# Patient Record
Sex: Male | Born: 1990 | Race: White | Hispanic: No | Marital: Single | Smoking: Never smoker
Health system: Southern US, Community
[De-identification: ages and names within clinical notes are randomized; demographics above are authoritative.]

---
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
## 2017-05-25 ENCOUNTER — Emergency Department
Admission: EM | Admit: 2017-05-25 | Discharge: 2017-05-25 | Disposition: A | Discharge status: Home / Self Care | Payer: BC Managed Care – PPO | Source: Home / Self Care | Attending: Family Medicine | Admitting: Family Medicine

## 2017-05-25 ENCOUNTER — Emergency Department (INDEPENDENT_AMBULATORY_CARE_PROVIDER_SITE_OTHER): Payer: BC Managed Care – PPO

## 2017-05-25 DIAGNOSIS — M25461 Effusion, right knee: Secondary | ICD-10-CM | POA: Diagnosis not present

## 2017-05-25 DIAGNOSIS — S8391XA Sprain of unspecified site of right knee, initial encounter: Secondary | ICD-10-CM | POA: Diagnosis not present

## 2017-05-25 NOTE — ED Provider Notes (Signed)
[facility]    CSN: [id number] Arrival date & time: 05/25/17  1641     History   Chief Complaint Chief Complaint  Patient presents with  . Knee Pain    HPI Patrick Bradley is a 27 y.o. male.   Patient reports that he went snowboarding two days ago, experiencing no injury, and yesterday walked a lot of stairs.  Today he has increased right anterior knee pain and limited range of motion.  He has a past history of right knee surgery for resection of a benign cyst.   The history is provided by the patient.  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  There are no active problems to display for this patient.        Home Medications    Prior to Admission medications   Not on File    Family History No family history on file.  Social History Social History   Tobacco Use  . Smoking status: Never Smoker  . Smokeless tobacco: Never Used  Substance Use Topics  . Alcohol use: No    Frequency: Never  . Drug use: No     Allergies   Patient has no known allergies.   Review of Systems Review of Systems  Constitutional: Negative for fatigue and fever.  Musculoskeletal: Positive for stiffness. Negative for back pain.  All other systems reviewed and are negative.    Physical Exam Triage Vital Signs ED Triage Vitals  Enc Vitals Group     BP  05/25/17 1702 137/78     Pulse Rate 05/25/17 1702 88     Resp 05/25/17 1702 16     Temp 05/25/17 1702 97.9 F (36.6 C)     Temp Source 05/25/17 1702 Oral     SpO2 05/25/17 1702 98 %     Weight 05/25/17 1703 178 lb 8 oz (81 kg)     Height 05/25/17 1703 6' (1.829 m)     Head Circumference --      Peak Flow --      Pain Score 05/25/17 1704 5     Pain Loc --      Pain Edu? --      Excl. in GC? --    No data found.  Updated Vital Signs BP 137/78 (BP Location: Right Arm)   Pulse 88   Temp 97.9 F (36.6 C) (Oral)   Resp 16   Ht 6' (1.829 m)   Wt 178 lb 8 oz (81 kg)   SpO2 98%   BMI 24.21 kg/m   Visual Acuity Right Eye Distance:   Left Eye Distance:   Bilateral Distance:    Right Eye Near:   Left Eye Near:    Bilateral Near:     Physical Exam  Constitutional: He appears well-developed and well-nourished. No distress.  HENT:  Head: Atraumatic.  Eyes: Pupils are equal, round, and reactive to light.  Neck: Normal range of motion.  Cardiovascular: Normal rate.  Pulmonary/Chest: Effort normal.  Musculoskeletal:       Left knee: He exhibits decreased range of motion. He exhibits no swelling, no ecchymosis, no deformity, no laceration, no erythema, normal alignment, no LCL laxity and normal meniscus. Tenderness found. LCL tenderness noted.       Legs: Right knee:  Mild swelling above patella; no erythema or warmth.  There is tenderness to palpation over the lateral collateral ligament.  Knee stable, negative drawer test.  McMurray test negative.  Has difficulty extending fully, and flexing more than 90 degrees.  Neurological: He is alert.  Skin: Skin is warm and dry.  Nursing note and vitals reviewed.    UC Treatments / Results  Labs (all labs ordered are listed, but only abnormal results are displayed) Labs Reviewed - No data to display  EKG  EKG Interpretation None       Radiology Dg Knee Complete 4 Views Right  Result Date: 05/25/2017 CLINICAL DATA:  RIGHT  knee pain and swelling since snowboarding 2 days ago, increased pain with flexion, past history of tumor resection in 2015 EXAM: RIGHT KNEE - COMPLETE 4+ VIEW COMPARISON:  None. FINDINGS: Osseous mineralization normal. Joint space narrowing medial and lateral compartments. Calcified body at suprapatellar recess with small joint effusion. Focus of cement at the lateral femoral condyles extending into intercondylar region with 2 anchors present laterally. No acute fracture, dislocation or bone destruction. IMPRESSION: Postsurgical changes of lateral femoral condyle. Joint effusion with calcified loose body at suprapatellar recess. Degenerative changes without acute bony abnormality. Electronically Signed   By: Ulyses SouthwardMark  Boles M.D.   On: 05/25/2017 18:03    Procedures Procedures (including critical care time)  Medications Ordered in UC Medications - No data to display   Initial Impression / Assessment and Plan / UC Course  I have reviewed the triage vital signs and the nursing notes.  Pertinent labs & imaging results that were available during my care of the patient were reviewed by me and considered in my medical decision making (see chart for details).    Ace wrap applied. Apply ice pack for 30 minutes, three to four times daily until swelling resolves.  Wear ace wrap until swelling resolves.  Elevate.  Use crutches for 3 to 5 days.  Begin wearing knee brace after discontinuing ace wrap.  Wear brace for about 2 to 3 weeks.  Begin range of motion and stretching exercises in about 5 days as tolerated.  May take Ibuprofen 200mg , 4 tabs every 8 hours with food.  Followup with Dr. Rodney Langtonhomas Thekkekandam or Dr. Clementeen GrahamEvan Corey ([facility]) if not improving about two weeks.     Final Clinical Impressions(s) / UC Diagnoses   Final diagnoses:  Sprain of right knee, unspecified ligament, initial encounter    ED Discharge Orders    None           Lattie HawBeese, Falan Hensler A, MD 05/29/17 [phone]

## 2017-05-25 NOTE — Discharge Instructions (Signed)
Apply ice pack for 30 minutes, three to four times daily until swelling resolves.  Wear ace wrap until swelling resolves.  Elevate.  Use crutches for 3 to 5 days.  Begin wearing knee brace after discontinuing ace wrap.  Wear brace for about 2 to 3 weeks.  Begin range of motion and stretching exercises in about 5 days as tolerated.  May take Ibuprofen 200mg , 4 tabs every 8 hours with food.

## 2017-05-25 NOTE — ED Triage Notes (Signed)
Pt c/o right knee swelling and pain. Pt states he went snowboarding 2 days ago and did a lot of stairs yesterday. No immediate pain after either event.

## 2017-06-03 ENCOUNTER — Ambulatory Visit (INDEPENDENT_AMBULATORY_CARE_PROVIDER_SITE_OTHER): Payer: BC Managed Care – PPO | Admitting: Family Medicine

## 2017-06-03 ENCOUNTER — Encounter: Payer: Self-pay | Admitting: Family Medicine

## 2017-06-03 DIAGNOSIS — M2341 Loose body in knee, right knee: Secondary | ICD-10-CM

## 2017-06-03 DIAGNOSIS — M2170 Unequal limb length (acquired), unspecified site: Secondary | ICD-10-CM | POA: Insufficient documentation

## 2017-06-03 NOTE — Patient Instructions (Addendum)
Thank you for coming in today. Work on quad strength with straight leg raises and toe out leg raises.  Work on trying to get the knee straight.  Consider a lift in the right shoe.  Recheck with me as needed.  Hapad Heel Wedge.

## 2017-06-03 NOTE — Progress Notes (Signed)
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 The required removal and cement via open surgery. He as a result of this surgery has a mild varus deformity and mild loss of full extension of the right knee. He however had been pain free. He was in his normal state of health until recently. He was sitting "[place] style" when he went to stand up. He felt a pop and had pain superior to his patella. He was seen in urgent care where he was given a brace, rest and NSAIDs. He feels almost 100% better now. He has resumed his normal activitiy.   Past medical history, Surgical history, Family history not pertinant except as noted below, Social history, Allergies, and medications have been entered into the medical record, reviewed, and no changes needed.   Review of Systems: No headache, visual changes, nausea, vomiting, diarrhea, constipation, dizziness, abdominal pain, skin rash, fevers, chills, night sweats, weight loss, swollen lymph nodes, body aches, joint swelling, muscle aches, chest pain, shortness of breath, mood changes, visual or auditory hallucinations.   Objective:    Vitals:   06/03/17 1514  BP: 119/64  Pulse: 77   General: Well Developed, well nourished, and in no acute distress.  Neuro/Psych: Alert and oriented x3, extra-ocular muscles intact, able to move all 4 extremities, sensation grossly intact. Skin: Warm and dry, no rashes noted.  Respiratory: Not using accessory muscles, speaking in full sentences, trachea midline.  Cardiovascular: Pulses palpable, no extremity edema. Abdomen: Does not appear distended. MSK: Right Knee: Large mature scare medial aspect.  Mild Varus deformity.  ROM 3-120 deg Non-tender.  Stable ligament exam Negative Mcmurray test Intact flexion strength. Extension strength is  4/5  Leg Length Right is 1 cm shorter  CLINICAL DATA:  RIGHT knee pain and swelling since snowboarding 2 days ago, increased pain with flexion, past history of tumor resection in 2015  EXAM: RIGHT KNEE - COMPLETE 4+ VIEW  COMPARISON:  None.  FINDINGS: Osseous mineralization normal.  Joint space narrowing medial and lateral compartments.  Calcified body at suprapatellar recess with small joint effusion.  Focus of cement at the lateral femoral condyles extending into intercondylar region with 2 anchors present laterally.  No acute fracture, dislocation or bone destruction.  IMPRESSION: Postsurgical changes of lateral femoral condyle.  Joint effusion with calcified loose body at suprapatellar recess.  Degenerative changes without acute bony abnormality.   Electronically Signed   By: Ulyses Southward M.D.   On: 05/25/2017 18:03  Impression and Recommendations:    Assessment and Plan: 27 y.o. male with .right knee pain now resolved. Like due to snapping tendon or soft tissue. Doubtful for significant ligament or tendon injury.  Patrick Bradley does have a calcified loose body in the suprapatellar space and I think this may have been the cause. However he seems asymptomatic now. Plan for watchful waiting at this time.   However he does have some knee issues to work on.  1) Decreased quad strength: Plan for straight leg raises to increase quad strength.  2) Leg length discrepancy: I provided a lift for the right shoe. Patrick Bradley will experiment with this to see if he feels better with this.     No orders of the defined types were placed in this encounter.  No  orders of the defined types were placed in this encounter.   Discussed warning signs or symptoms. Please see discharge instructions. Patient expresses understanding.

## 2017-06-04 DIAGNOSIS — M2340 Loose body in knee, unspecified knee: Secondary | ICD-10-CM | POA: Insufficient documentation

## 2017-11-12 ENCOUNTER — Telehealth: Payer: Self-pay | Admitting: General Practice

## 2017-11-12 NOTE — Telephone Encounter (Signed)
Needs a referral to a physical therapist. Please advise

## 2018-01-26 ENCOUNTER — Ambulatory Visit (INDEPENDENT_AMBULATORY_CARE_PROVIDER_SITE_OTHER): Payer: BC Managed Care – PPO | Admitting: Family Medicine

## 2018-01-26 ENCOUNTER — Encounter: Payer: Self-pay | Admitting: Family Medicine

## 2018-01-26 DIAGNOSIS — M25561 Pain in right knee: Secondary | ICD-10-CM

## 2018-01-26 NOTE — Progress Notes (Signed)
Patrick Bradley is a 27 y.o. male who presents to [facility] today for knee pain.   He has PMH significant for surgical removal of a benign cartilaginous tumor about 10 years ago and has been having pain off and on since. He was seen here in January following a work up in the ED. No structural abnormalities were found but he was given a heel lift for a slight leg length discrepancy. He says the pain is the worst in the back of the knee and above the knee cap but says it is diffuse all over. He has some good days and some bad days but overall is not too debilitating. He has been using the heel lift and says that it has been helping him. He takes ibuprofen when needed but not often.     ROS:  As above  Exam:  BP 107/70   Pulse 69   Wt 176 lb (79.8 kg)   BMI 23.87 kg/m  General: Well Developed, well nourished, and in no acute distress.  Neuro/Psych: Alert and oriented x3, extra-ocular muscles intact, able to move all 4 extremities, sensation grossly intact. Skin: Warm and dry, no rashes noted.  Respiratory: Not using accessory muscles, speaking in full sentences, trachea midline.  Cardiovascular: Pulses palpable, no extremity edema. Abdomen: Does not appear distended. Right Knee: Slight varus deformity. Vertical scar medial to patella. Non erythematous. Not warm.  Pain and guarding at medial joint line. Significant crepitations.  ROM slightly decreased with extension to 5 degrees.  Strength 4+/5 to extension and 5/5 with flexion. (5/5 flexion and extension contralaterally)  No laxity or pain to varus/valgus. Positive medial McMurray test  2+ patellar and achilles reflexes bilaterally    Lab and Radiology Results  CLINICAL DATA:  RIGHT knee pain and swelling since snowboarding 2 days ago, increased pain with flexion, past history of tumor resection in 2015  EXAM: RIGHT KNEE - COMPLETE 4+ VIEW  COMPARISON:   None.  FINDINGS: Osseous mineralization normal.  Joint space narrowing medial and lateral compartments.  Calcified body at suprapatellar recess with small joint effusion.  Focus of cement at the lateral femoral condyles extending into intercondylar region with 2 anchors present laterally.  No acute fracture, dislocation or bone destruction.  IMPRESSION: Postsurgical changes of lateral femoral condyle.  Joint effusion with calcified loose body at suprapatellar recess.  Degenerative changes without acute bony abnormality.   Electronically Signed   By: Ulyses Southward M.D.   On: 05/25/2017 18:03 I personally (independently) visualized and performed the interpretation of the images attached in this note.    Assessment and Plan: 27 y.o. male with right knee pain. He has a history of surgery to remove a benign tumor and also a history suggestive a knee sprain in January. He has point tenderness at the medial joint line and a significantly positive medial McMurray test suggestive of a medial meniscus tear. The plan will be to get an MRI to further characterize the meniscus and also investigate the calcification in his quadriceps tendon visible on x ray.   I spent 25 minutes with this patient, greater than 50% was face-to-face time counseling regarding ddx and plan.   Orders Placed This Encounter  Procedures  . MR Knee Right Wo Contrast    Standing Status:   Future    Standing Expiration Date:   03/29/2019    Order Specific Question:   What is the patient's sedation requirement?    Answer:  No Sedation    Order Specific Question:   Does the patient have a pacemaker or implanted devices?    Answer:   No    Order Specific Question:   Preferred imaging location?    Answer:   [facility] (table limit-350lbs)    Order Specific Question:   Radiology Contrast Protocol - do NOT remove file path    Answer:   [email or web address]   No orders of  the defined types were placed in this encounter.   Historical information moved to improve visibility of documentation.  History reviewed. No pertinent past medical history. Past Surgical History:  Procedure Laterality Date  . KNEE ARTHROPLASTY  2009   Social History   Tobacco Use  . Smoking status: Never Smoker  . Smokeless tobacco: Never Used  Substance Use Topics  . Alcohol use: No    Frequency: Never   family history is not on file.  Medications: No current outpatient medications on file.   No current facility-administered medications for this visit.    No Known Allergies    Discussed warning signs or symptoms. Please see discharge instructions. Patient expresses understanding.  I personally was present and performed or re-performed the history, physical exam and medical decision-making activities of this service and have verified that the service and findings are accurately documented in the student's note. ___________________________________________ Clementeen Graham M.D., ABFM., CAQSM. Primary Care and Sports Medicine Adjunct Instructor of Family Medicine  [employer]

## 2018-01-26 NOTE — Patient Instructions (Signed)
Thank you for coming in today. You should hear about MRI soon.  Let me know if you do not hear anything.  Recheck a few days after the MRI.    Meniscus Tear A meniscus tear is a knee injury in which a piece of the meniscus is torn. The meniscus is a thick, rubbery, wedge-shaped cartilage in the knee. Two menisci are located in each knee. They sit between the upper bone (femur) and lower bone (tibia) that make up the knee joint. Each meniscus acts as a shock absorber for the knee. A torn meniscus is one of the most common types of knee injuries. This injury can range from mild to severe. Surgery may be needed for a severe tear. What are the causes? This injury may be caused by any squatting, twisting, or pivoting movement. Sports-related injuries are the most common cause. These often occur from:  Running and stopping suddenly.  Changing direction.  Being tackled or knocked off your feet.  As people get older, their meniscus gets thinner and weaker. In these people, tears can happen more easily, such as from climbing stairs. What increases the risk? This injury is more likely to happen to:  People who play contact sports.  Males.  People who are 69?25 years of age.  What are the signs or symptoms? Symptoms of this injury include:  Knee pain, especially at the side of the knee joint. You may feel pain when the injury occurs, or you may only hear a pop and feel pain later.  A feeling that your knee is clicking, catching, locking, or giving way.  Not being able to fully bend or extend your knee.  Bruising or swelling in your knee.  How is this diagnosed? This injury may be diagnosed based on your symptoms and a physical exam. The physical exam may include:  Moving your knee in different ways.  Feeling for tenderness.  Listening for a clicking sound.  Checking if your knee locks or catches.  You may also have tests, such as:  X-rays.  MRI.  A procedure to look  inside your knee with a narrow surgical telescope (arthroscopy).  You may be referred to a knee specialist (orthopedic surgeon). How is this treated? Treatment for this injury depends on the severity of the tear. Treatment for a mild tear may include:  Rest.  Medicine to reduce pain and swelling. This is usually a nonsteroidal anti-inflammatory drug (NSAID).  A knee brace or an elastic sleeve or wrap.  Using crutches or a walker to keep weight off your knee and to help you walk.  Exercises to strengthen your knee (physical therapy).  You may need surgery if you have a severe tear or if other treatments are not working. Follow these instructions at home: Managing pain and swelling  Take over-the-counter and prescription medicines only as told by your health care provider.  If directed, apply ice to the injured area: ? Put ice in a plastic bag. ? Place a towel between your skin and the bag. ? Leave the ice on for 20 minutes, 2-3 times per day.  Raise (elevate) the injured area above the level of your heart while you are sitting or lying down. Activity  Do not use the injured limb to support your body weight until your health care provider says that you can. Use crutches or a walker as told by your health care provider.  Return to your normal activities as told by your health care provider. Ask your  health care provider what activities are safe for you.  Perform range-of-motion exercises only as told by your health care provider.  Begin doing exercises to strengthen your knee and leg muscles only as told by your health care provider. After you recover, your health care provider may recommend these exercises to help prevent another injury. General instructions  Use a knee brace or elastic wrap as told by your health care provider.  Keep all follow-up visits as told by your health care provider. This is important. Contact a health care provider if:  You have a fever.  Your  knee becomes red, tender, or swollen.  Your pain medicine is not helping.  Your symptoms get worse or do not improve after 2 weeks of home care. This information is not intended to replace advice given to you by your health care provider. Make sure you discuss any questions you have with your health care provider. Document Released: 07/27/2002 Document Revised: 10/12/2015 Document Reviewed: 08/29/2014 Elsevier Interactive Patient Education  [employer].

## 2018-03-02 ENCOUNTER — Ambulatory Visit (INDEPENDENT_AMBULATORY_CARE_PROVIDER_SITE_OTHER): Payer: BC Managed Care – PPO

## 2018-03-02 DIAGNOSIS — Z86018 Personal history of other benign neoplasm: Secondary | ICD-10-CM | POA: Diagnosis not present

## 2018-03-02 DIAGNOSIS — M25561 Pain in right knee: Secondary | ICD-10-CM | POA: Diagnosis not present

## 2018-03-12 ENCOUNTER — Encounter: Payer: Self-pay | Admitting: Family Medicine

## 2018-03-12 ENCOUNTER — Ambulatory Visit (INDEPENDENT_AMBULATORY_CARE_PROVIDER_SITE_OTHER): Payer: BC Managed Care – PPO | Admitting: Family Medicine

## 2018-03-12 VITALS — BP 113/75 | HR 62 | Ht 70.0 in | Wt 173.0 lb

## 2018-03-12 DIAGNOSIS — M25561 Pain in right knee: Secondary | ICD-10-CM

## 2018-03-12 DIAGNOSIS — M1731 Unilateral post-traumatic osteoarthritis, right knee: Secondary | ICD-10-CM | POA: Diagnosis not present

## 2018-03-12 MED ORDER — DICLOFENAC SODIUM 1 % TD GEL: 4.0000 g | Freq: Four times a day (QID) | TRANSDERMAL | 11 refills | Status: AC

## 2018-03-12 NOTE — Progress Notes (Signed)
Patrick Bradley is a 27 y.o. male who presents to [facility] today for follow-up knee pain and MRI.  Patrick Bradley was last seen a few weeks ago for right medial knee pain.  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  He is been having knee pain off and on for the last few weeks and was failing to improve.  He was having some mechanical symptoms including some clicking then as well.  He had an MRI in the interval and is here for follow-up.  He notes in the interval he is having some improvement in pain along the medial knee.  He does note however some continued medial knee pain and he would like to be able to exercise a bit more.  His goals are to do some limited weight lifting and some limited running.    ROS:  As above  Exam:  BP 113/75   Pulse 62   Ht 5\' 10"  (1.778 m)   Wt 173 lb (78.5 kg)   BMI 24.82 kg/m  General: Well Developed, well nourished, and in no acute distress.  Neuro/Psych: Alert and oriented x3, extra-ocular muscles intact, able to move all 4 extremities, sensation grossly intact. Skin: Warm and dry, no rashes noted.  Respiratory: Not using accessory muscles, speaking in full sentences, trachea midline.  Cardiovascular: Pulses palpable, no extremity edema. Abdomen: Does not appear distended. MSK: Right knee normal motion nontender normal gait.    Lab and Radiology Results EXAM: MRI OF THE RIGHT KNEE WITHOUT CONTRAST  TECHNIQUE: Multiplanar, multisequence MR imaging of the knee was performed. No intravenous contrast was administered.  COMPARISON:  Plain films right knee 05/25/2017.  FINDINGS: MENISCI  Medial meniscus:  Intact.  Lateral meniscus:  Intact.  LIGAMENTS  Cruciates:  Intact.  Collaterals:  Intact.  CARTILAGE  Patellofemoral:  Preserved.  Medial: Marked cartilage thinning and irregularity are seen  along the weight-bearing medial femoral condyle measuring approximately 3.3 cm AP by 1.6 cm transverse. Flattening and irregularity of the underlying subchondral bone plate are identified and there is associated subchondral edema.  Lateral:  Preserved.  Joint: No effusion. Loose body in the patellofemoral compartment seen on the prior plain films is not visible on this examination. There is a 0.5 cm in diameter loose body projecting superior to the posterior aspect of the medial femoral condyle.  Popliteal Fossa:  No Baker's cyst.  Extensor Mechanism:  Intact.  Bones: Area of signal dropout in the medial femoral condyle consistent curettage and packing a benign bone tumor is identified. Small osteophytes are present about the knee.  Other: None.  IMPRESSION: Negative for meniscal or ligament tear.  Cartilage loss with flattening and irregularity of the subchondral bone plate and subchondral edema in the medial femoral condyle are likely related to the patient's bone tumor.  Loose body in the patellofemoral compartment seen on the prior plain films is not visualized on this exam. Small loose body superior to the posterior aspect of the medial femoral condyle is identified.  Status post curettage and packing of a lesion in the medial femoral condyle.   Electronically Signed   By: Drusilla Kanner M.D.   On: 03/02/2018 09:17 I personally (independently) visualized and performed the interpretation of the images attached in this note.     Assessment and Plan: 27 y.o. male with  Right medial knee pain due to postsurgical degenerative changes.  Patient  has bone edema in the subchondral medial femur due to cartilage loss and postsurgical/posttraumatic changes.  Fortunately he does not have a meniscus or ligament injury.  Loose body is now well tucked away in the posterior knee and likely not a factor here.  Discussed treatment options.  Plan for continued quad  strengthening exercises and activity as tolerated.  Advance activity as tolerated.  Continue leg length correction with heel lift.  Additionally will consider medial off loader brace.  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  I think he will tolerate a medial off loader brace quite well with activity which will certainly improve his quality of life and exercise capacity.  Additionally we will try some diclofenac gel and recheck as needed in the future.  Additionally will proceed with some physical therapy as well.  I spent 15 minutes with this patient, greater than 50% was face-to-face time counseling regarding ddx and plan.   Orders Placed This Encounter  Procedures  . Ambulatory referral to Physical Therapy    Referral Priority:   Routine    Referral Type:   Physical Medicine    Referral Reason:   Specialty Services Required    Requested Specialty:   Physical Therapy   Meds ordered this encounter  Medications  . diclofenac sodium (VOLTAREN) 1 % GEL    Sig: Apply 4 g topically 4 (four) times daily. To affected joint.    Dispense:  100 g    Refill:  11    Knee OA. Tried and failed ibuprofen and aleve    Historical information moved to improve visibility of documentation.  No past medical history on file. Past Surgical History:  Procedure Laterality Date  . KNEE ARTHROPLASTY  2009   Social History   Tobacco Use  . Smoking status: Never Smoker  . Smokeless tobacco: Never Used  Substance Use Topics  . Alcohol use: No    Frequency: Never   family history is not on file.  Medications: Current Outpatient Medications  Medication Sig Dispense Refill  . diclofenac sodium (VOLTAREN) 1 % GEL Apply 4 g topically 4 (four) times daily. To affected joint. 100 g 11   No current facility-administered medications for this visit.    No Known Allergies    Discussed warning signs or symptoms. Please see discharge instructions. Patient  expresses understanding.

## 2018-03-12 NOTE — Patient Instructions (Addendum)
Thank you for coming in today. Advance acitvity as tolerated.  Work on Systems developer.  You should hear about the medial offloader brace.  Try the diclofenac gel.  Recheck with me as needed.    Recheck with me as needed.

## 2018-03-18 ENCOUNTER — Ambulatory Visit: Payer: BC Managed Care – PPO | Admitting: Rehabilitative and Restorative Service Providers"

## 2018-03-18 ENCOUNTER — Encounter: Payer: Self-pay | Admitting: Rehabilitative and Restorative Service Providers"

## 2018-03-18 DIAGNOSIS — R29898 Other symptoms and signs involving the musculoskeletal system: Secondary | ICD-10-CM | POA: Diagnosis not present

## 2018-03-18 DIAGNOSIS — M25561 Pain in right knee: Secondary | ICD-10-CM | POA: Diagnosis not present

## 2018-03-18 DIAGNOSIS — G8929 Other chronic pain: Secondary | ICD-10-CM

## 2018-03-18 DIAGNOSIS — M6281 Muscle weakness (generalized): Secondary | ICD-10-CM

## 2018-03-18 NOTE — Patient Instructions (Signed)
HIP: Hamstrings - Supine  Place strap around foot. Raise leg up, keeping knee straight.  Bend opposite knee to protect back if indicated. Hold 30 seconds. 3 reps per set, 2-3 sets per day  Outer Hip Stretch: Reclined IT Band Stretch (Strap)   Strap around one foot, pull leg across body until you feel a pull or stretch in the outside of your hip, with shoulders on mat. Hold for 30 seconds. Repeat 3 times each leg. 2-3 times/day.  Piriformis Stretch   Lying on back, pull right knee toward opposite shoulder. Hold 30 seconds. Repeat 3 times. Do 2-3 sessions per day.   Quads / HF, Prone KNEE: Quadriceps - Prone    Place strap around ankle. Bring ankle toward buttocks. Press hip into surface. Hold 30 seconds. Repeat 3 times per session. Do 2-3 sessions per day.   

## 2018-03-18 NOTE — Therapy (Addendum)
[facility] [place] [address] [place], [place], [address] Phone: [phone]   Fax:  [phone]  Physical Therapy Evaluation  Patient Details  Name: Patrick Bradley MRN: [id number] Date of Birth: [birth year] Referring Provider (PT): Dr Clementeen Graham    Encounter Date: 03/18/2018  PT End of Session - 03/18/18 1334    Visit Number  1    Number of Visits  12    Date for PT Re-Evaluation  04/29/18    PT Start Time  0930    PT Stop Time  1029    PT Time Calculation (min)  59 min    Activity Tolerance  Patient tolerated treatment well       History reviewed. No pertinent past medical history.  Past Surgical History:  Procedure Laterality Date  . KNEE ARTHROPLASTY  2009    There were no vitals filed for this visit.   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 He underwent surgery to stabalize the bone including medical cement Rt knee 2009. He has done well since surgery until December when he bent knee the wrong way and heard a loud pop. He has had pain since that time. Knee swelled and he had difficulty walking. he was given a heel lift with some help but he has had continued pain on an intermittent basis. He was seen by Dr Denyse Amass with MRI showing degenerative changes.     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  Yes    Pain Score  2     Pain Location  Knee    Pain Orientation  Right;Medial    Pain Descriptors / Indicators  Tightness    Pain Type  Acute pain;Chronic pain    Pain Onset  More than a month ago    Pain Frequency  Intermittent    Aggravating Factors   moving in certain directions    Pain Relieving Factors  not moving in  those directions          [facility] PT Assessment - 03/18/18 0001      Assessment   Medical Diagnosis  Rt knee sprain    Referring Provider (PT)  Dr Clementeen Graham     Onset Date/Surgical Date  05/03/17    Hand Dominance  Right    Next MD Visit  PRN    Prior Therapy  none      Precautions   Precautions  None      Restrictions   Weight Bearing Restrictions  No      Balance Screen   Has the patient fallen in the past 6 months  No    Has the patient had a decrease in activity level because of a fear of falling?   No    Is the patient reluctant to leave their home because of a fear of falling?   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; cleaning; walking - student     Leisure  enjoys working out gym - less since knee injury - cardio/upper body bench press; machines - tries squats with minimal wt       Observation/Other Assessments   Focus on Therapeutic Outcomes (FOTO)   47% limiation       Sensation   Additional Comments  WFL's per pt report       AROM   Right Hip Extension  --   tight end range    Right Hip Flexion  --   WFL's    Right Hip ABduction  --   tight    Right Hip ADduction  --   WFL's   Left Hip Extension  --   WFL's   Left Hip Flexion  --   WFL's    Left Hip ABduction  --   tight    Left Hip ADduction  --   WFL's    Right Knee Extension  -13    Right Knee Flexion  123    Left Knee Extension  0    Left Knee Flexion  130      Strength   Right Hip Flexion  5/5    Right Hip Extension  5/5    Right Hip ABduction  4/5    Right Hip ADduction  4/5    Left Hip Flexion  5/5    Left Hip Extension  5/5    Left Hip ABduction  5/5    Left Hip ADduction  5/5     Right Knee Flexion  5/5    Right Knee Extension  5/5    Left Knee Flexion  5/5    Left Knee Extension  5/5    Right Ankle Dorsiflexion  5/5    Right Ankle Plantar Flexion  4+/5    Left Ankle Dorsiflexion  5/5    Left Ankle Plantar Flexion  5/5      Flexibility   Hamstrings  Rt  54 deg; Lt 46    Quadriceps  Rt 117 deg; Lt 128     ITB  tight Rt    Piriformis  tight Rt                 Objective measurements completed on examination: See above findings.    Treatment - stretching exercises - see HEP  Vaso  15 min medium pressure 34 deg           PT Education - 03/18/18 1012    Education Details  HEP     Person(s) Educated  Patient    Methods  Explanation;Demonstration;Tactile cues;Verbal cues;Handout;Other (comment)    Comprehension  Verbalized understanding;Returned demonstration;Verbal cues required;Tactile cues required          PT Long Term Goals - 03/18/18 1351      PT LONG TERM GOAL #1   Title  Increase Rt knee ROM by 5-7 degrees 04/29/18    Time  6    Period  Weeks    Status  New      PT LONG TERM GOAL #2   Title  Increase strength Rt LE to 5-/5 to 5/5 throughout 04/29/18    Time  6    Period  Weeks    Status  New      PT LONG TERM GOAL #3  Title  Decrease Rt knee pain by 50-75% allowing patient to participate in normal functional activities with minimal to no pain 04/29/18    Time  6    Period  Weeks    Status  New      PT LONG TERM GOAL #4   Title  Independent in HEP including appropriate gym program 04/29/18    Time  6    Period  Weeks    Status  New      PT LONG TERM GOAL #5   Title  Improve FOTO to </= 27% limitation 04/29/18    Time  6    Period  Weeks    Status  New             Plan - 03/18/18 1336    Clinical Impression Statement  Patrick Bradley presents with 10-11 month history of Rt knee pain after sitting in an awkward position 2/18. He has had diagnostic testing(see MRI findings). He has limited Rt hip/knee ROM;  decreased Rt LE strength; abnormal gait pattern; pain limiting functional activities.     History and Personal Factors relevant to plan of care:  Gorham's disease(bone disease) with stabilization surgery Rt knee ~ 2009. Patient reports that he had no therapy post surgery has had no further problems with knee or other joints    Clinical Presentation  Stable    Clinical Presentation due to:  limited ROM Rt knee - uncertain of length of time of decreased ROM     Clinical Decision Making  Low    Rehab Potential  Good    PT Frequency  2x / week    PT Duration  6 weeks    PT Treatment/Interventions  Patient/family education;ADLs/Self Care Home Management;Cryotherapy;Electrical Stimulation;Iontophoresis 4mg /ml Dexamethasone;Moist Heat;Ultrasound;Dry needling;Manual techniques;Neuromuscular re-education;Functional mobility training;Therapeutic activities;Therapeutic exercise;Balance training    PT Next Visit Plan  review HEP; progress with stretching Rt hip and knee; work to gain ROM Rt knee extension; strengthening Rt LE: gait training; assess leg length difference/pelvic assymetry; modlaities as indicated     Consulted and Agree with Plan of Care  Patient       Patient will benefit from skilled therapeutic intervention in order to improve the following deficits and impairments:  Postural dysfunction, Improper body mechanics, Pain, Increased fascial restricitons, Increased muscle spasms, Hypomobility, Decreased mobility, Decreased range of motion, Decreased strength, Decreased activity tolerance, Abnormal gait  Visit Diagnosis: Chronic pain of right knee - Plan: PT plan of care cert/re-cert  Other symptoms and signs involving the musculoskeletal system - Plan: PT plan of care cert/re-cert  Muscle weakness (generalized) - Plan: PT plan of care cert/re-cert     Problem List Patient Active Problem List   Diagnosis Date Noted  . Loose body in knee 06/04/2017  . Leg length discrepancy 06/03/2017     Viktoriya Glaspy Rober Minion PT, MPH  03/18/2018, 2:00 PM  [facility] [address] [place], [place], [address] Phone: [phone]   Fax:  [phone]  Name: Patrick Bradley MRN: [id number] Date of Birth: 01-14-1991

## 2018-03-20 ENCOUNTER — Ambulatory Visit: Payer: BC Managed Care – PPO | Admitting: Rehabilitative and Restorative Service Providers"

## 2018-03-20 ENCOUNTER — Encounter: Payer: Self-pay | Admitting: Rehabilitative and Restorative Service Providers"

## 2018-03-20 DIAGNOSIS — G8929 Other chronic pain: Secondary | ICD-10-CM | POA: Diagnosis not present

## 2018-03-20 DIAGNOSIS — M6281 Muscle weakness (generalized): Secondary | ICD-10-CM | POA: Diagnosis not present

## 2018-03-20 DIAGNOSIS — M25561 Pain in right knee: Secondary | ICD-10-CM

## 2018-03-20 DIAGNOSIS — R29898 Other symptoms and signs involving the musculoskeletal system: Secondary | ICD-10-CM

## 2018-03-20 NOTE — Patient Instructions (Addendum)
[employer]" - can check at [employer] or sports stores - to massage the hamstrings and calf   Outer Hip Stretch: Reclined IT Band Stretch (Strap)   Strap around one foot, pull leg across body until you feel a pull or stretch in the outside of your hip, with shoulders on mat. Hold for 30 seconds. Repeat 3 times each leg. 2-3 times/day.   Quads / HF, Supine   Lie near edge of bed, pull both knees up toward chest. Hold one knee as you drop the other leg off the edge of the bed.  Relax hanging knee/can bend knee back if indicated. Hold 30 seconds. Repeat 3 times per session. Do 2-3 sessions per day.   Quad Sets lying down on back     Slowly tighten thigh muscles of straight, left leg while counting out loud to __5 sec __. Relax. Repeat __10__ times. 1-2 sets  Do _1___ sessions per day.   KNEE: Extension, Short Arc Quads - Supine    Place bolster under knees. Raise one leg until knee is straight. Hold 5 sec _10__ reps per set, _1-3__ sets 1 time/day  Quadriceps Set (Prone)    With toes supporting lower legs, tighten thigh muscles to straighten knees. Hold _5___ seconds. Relax. Repeat __10__ times per set. Do __1-3__ sets per session. Do __1__ sessions per day.   Chair Sitting    Sit at edge of seat, spine straight, one leg extended. Put a hand on each thigh and bend forward from the hip, keeping spine straight. Allow hand on extended leg to reach toward toes. Support upper body with other arm. Hold _30__ seconds. Repeat __3_ times per session. Do __3-4 _ sessions per day.    Calf Stretch    Place hands on wall at shoulder height. Keeping back leg straight, bend front leg, feet pointing forward, heels flat on floor. Lean forward slightly until stretch is felt in calf of back leg. Hold stretch ___ seconds, breathing slowly in and out. Repeat stretch with other leg back. Do ___ sessions per day. Variation: Use chair or table for support.  Terminal Knee Extension  (Standing)   Keep weight equal on both legs  Facing anchor with right knee slightly bent and tubing just above knee, gently pull knee back straight. Hold 5 seconds. Repeat __10__ times per set. Do ____ sets per session. Do ____ sessions per day.

## 2018-03-20 NOTE — Therapy (Signed)
[facility] [place] [address] [place], [place], [address] Phone: [phone]   Fax:  [phone]  Physical Therapy Treatment  Patient Details  Name: Patrick Bradley MRN: [id number] Date of Birth: 03-31-1991 Referring Provider (PT): Dr Clementeen Graham    Encounter Date: 03/20/2018  PT End of Session - 03/20/18 1120    Visit Number  2    Number of Visits  12    Date for PT Re-Evaluation  04/29/18    PT Start Time  1120    PT Stop Time  1214    PT Time Calculation (min)  54 min    Activity Tolerance  Patient tolerated treatment well       History reviewed. No pertinent past medical history.  Past Surgical History:  Procedure Laterality Date  . KNEE ARTHROPLASTY  2009    There were no vitals filed for this visit.  Subjective Assessment - 03/20/18 1121    Subjective  Patient reports that he is having no pain in the knee today. He is doing his exercises at home. He is using a strap he found at home to do the stretching.     Currently in Pain?  No/denies                       [facility] Adult PT Treatment/Exercise - 03/20/18 0001      Therapeutic Activites    Therapeutic Activities  --   instructed in self massage with massage stick     Neuro Re-ed    Neuro Re-ed Details   working on standing with improved Rt knee extension       Knee/Hip Exercises: Stretches   Passive Hamstring Stretch  Right;3 reps;30 seconds   supine with strap    Passive Hamstring Stretch Limitations  seated HS stretch 30 sec x 3     Quad Stretch  Right;3 reps;30 seconds   prone with strap    Hip Flexor Stretch  Right;3 reps;30 seconds   supine thomas and seated 3 each    ITB Stretch  Right;3 reps;30 seconds   supine with strap    Piriformis Stretch  Right;3 reps;30 seconds   supine travell    Gastroc Stretch  Right;3 reps;30 seconds      Knee/Hip Exercises: Aerobic   Nustep  L5 x 6 min       Knee/Hip Exercises: Standing    Terminal Knee Extension  AROM;Strengthening;Right;2 sets;5 reps    Theraband Level (Terminal Knee Extension)  Level 4 (Blue)   TB with second set      Knee/Hip Exercises: Supine   Quad Sets  AROM;Strengthening;Right;10 reps   5 sec    Short Arc Quad Sets  AROM;Strengthening      Knee/Hip Exercises: Prone   Other Prone Exercises  knee extension toe down 5 sec x 10       Vasopneumatic   Number Minutes Vasopneumatic   15 minutes    Vasopnuematic Location   Knee   Rt   Vasopneumatic Pressure  Medium    Vasopneumatic Temperature   34 deg       Manual Therapy   Soft tissue mobilization  STM Rt hamstrings/gastroc/soleus musculature              PT Education - 03/20/18 1203    Education Details  HEP     Person(s) Educated  Patient    Methods  Explanation;Demonstration;Tactile cues;Verbal cues;Handout    Comprehension  Verbalized understanding;Returned  demonstration;Verbal cues required;Tactile cues required          PT Long Term Goals - 03/18/18 1351      PT LONG TERM GOAL #1   Title  Increase Rt knee ROM by 5-7 degrees 04/29/18    Time  6    Period  Weeks    Status  New      PT LONG TERM GOAL #2   Title  Increase strength Rt LE to 5-/5 to 5/5 throughout 04/29/18    Time  6    Period  Weeks    Status  New      PT LONG TERM GOAL #3   Title  Decrease Rt knee pain by 50-75% allowing patient to participate in normal functional activities with minimal to no pain 04/29/18    Time  6    Period  Weeks    Status  New      PT LONG TERM GOAL #4   Title  Independent in HEP including appropriate gym program 04/29/18    Time  6    Period  Weeks    Status  New      PT LONG TERM GOAL #5   Title  Improve FOTO to </= 27% limitation 04/29/18    Time  6    Period  Weeks    Status  New            Plan - 03/20/18 1122    Clinical Impression Statement  Some soreness following initial visit but did okay - working on HEP. Tolerated additional stretching and initiation  some strengthening for terminal knee extension. Difficulty relaxing for soft tissue work - ok for self massage for hamstrings/calf using massage stick. Does great with vaso with good decrease in soreness post treatment     Rehab Potential  Good    PT Frequency  2x / week    PT Duration  6 weeks    PT Treatment/Interventions  Patient/family education;ADLs/Self Care Home Management;Cryotherapy;Electrical Stimulation;Iontophoresis 4mg /ml Dexamethasone;Moist Heat;Ultrasound;Dry needling;Manual techniques;Neuromuscular re-education;Functional mobility training;Therapeutic activities;Therapeutic exercise;Balance training    PT Next Visit Plan  review HEP; progress with stretching Rt hip and knee; work to gain ROM Rt knee extension; strengthening Rt LE: gait training; assess leg length difference/pelvic assymetry; modlaities as indicated     Consulted and Agree with Plan of Care  Patient       Patient will benefit from skilled therapeutic intervention in order to improve the following deficits and impairments:  Postural dysfunction, Improper body mechanics, Pain, Increased fascial restricitons, Increased muscle spasms, Hypomobility, Decreased mobility, Decreased range of motion, Decreased strength, Decreased activity tolerance, Abnormal gait  Visit Diagnosis: Chronic pain of right knee  Other symptoms and signs involving the musculoskeletal system  Muscle weakness (generalized)     Problem List Patient Active Problem List   Diagnosis Date Noted  . Loose body in knee 06/04/2017  . Leg length discrepancy 06/03/2017    Wendell Nicoson Rober Minion PT, MPH 03/20/2018, 12:39 PM  [facility] [address] [place], [place], [address] Phone: [phone]   Fax:  [phone]  Name: Patrick Bradley MRN: [id number] Date of Birth: February 13, 1991

## 2018-03-23 ENCOUNTER — Encounter: Payer: Self-pay | Admitting: Physical Therapy

## 2018-03-23 ENCOUNTER — Ambulatory Visit: Payer: BC Managed Care – PPO | Admitting: Physical Therapy

## 2018-03-23 DIAGNOSIS — M25561 Pain in right knee: Secondary | ICD-10-CM | POA: Diagnosis not present

## 2018-03-23 DIAGNOSIS — M6281 Muscle weakness (generalized): Secondary | ICD-10-CM

## 2018-03-23 DIAGNOSIS — G8929 Other chronic pain: Secondary | ICD-10-CM | POA: Diagnosis not present

## 2018-03-23 DIAGNOSIS — R29898 Other symptoms and signs involving the musculoskeletal system: Secondary | ICD-10-CM

## 2018-03-23 NOTE — Therapy (Signed)
[facility] [place] [address] [place] [address] [place], [place], [address] Phone: [phone]   Fax:  [phone]  Physical Therapy Treatment  Patient Details  Name: Patrick Bradley MRN: [id number] Date of Birth: Oct 06, 1990 Referring Provider (PT): Dr Clementeen Graham    Encounter Date: 03/23/2018  PT End of Session - 03/23/18 1407    Visit Number  3    Number of Visits  12    Date for PT Re-Evaluation  04/29/18    PT Start Time  1404    PT Stop Time  1501    PT Time Calculation (min)  57 min    Activity Tolerance  Patient tolerated treatment well    Behavior During Therapy  [facility] for tasks assessed/performed       History reviewed. No pertinent past medical history.  Past Surgical History:  Procedure Laterality Date  . KNEE ARTHROPLASTY  2009    There were no vitals filed for this visit.  Subjective Assessment - 03/23/18 1408    Subjective  Pt reports no new changes since last visit.  He would like to start a running program.  He says the doctor will give him a knee brace sometime soon.  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  No/denies    Pain Score  0-No pain         [facility] PT Assessment - 03/23/18 0001      AROM   Right Knee Extension  -10    Right Knee Flexion  123      Flexibility   Quadriceps  Rt 118 deg; Lt 134 deg.        [facility] Adult PT Treatment/Exercise - 03/23/18 0001      Exercises   Exercises  Knee/Hip      Knee/Hip Exercises: Stretches   Passive Hamstring Stretch  Right;Left;2 reps;30 seconds   seated    Quad Stretch  Right;3 reps;Left;1 rep;30 seconds   prone quad stretch   Hip Flexor Stretch  Right;Left;2 reps;30 seconds   seated   Piriformis Stretch   Right;Left;2 reps;30 seconds    Gastroc Stretch  Right;Left;30 seconds;3 reps      Knee/Hip Exercises: Aerobic   Recumbent Bike  L2: 5 min      Knee/Hip Exercises: Standing   Heel Raises  Both;2 sets;10 reps      Knee/Hip Exercises: Sidelying   Hip ABduction  Strengthening;Right;2 sets;10 reps      Knee/Hip Exercises: Prone   Other Prone Exercises  terminal Rt knee ext with toes tucked x 10 sec x 10 reps       Vasopneumatic   Number Minutes Vasopneumatic   15 minutes    Vasopnuematic Location   Knee   Rt   Vasopneumatic Pressure  Medium    Vasopneumatic Temperature   34 deg              PT Education - 03/23/18 1459    Education Details  HEP --added hip abdct.    Person(s) Educated  Patient    Methods  Explanation;Demonstration;Verbal cues;Handout    Comprehension  Verbalized understanding;Returned demonstration          PT  Long Term Goals - 03/18/18 1351      PT LONG TERM GOAL #1   Title  Increase Rt knee ROM by 5-7 degrees 04/29/18    Time  6    Period  Weeks    Status  New      PT LONG TERM GOAL #2   Title  Increase strength Rt LE to 5-/5 to 5/5 throughout 04/29/18    Time  6    Period  Weeks    Status  New      PT LONG TERM GOAL #3   Title  Decrease Rt knee pain by 50-75% allowing patient to participate in normal functional activities with minimal to no pain 04/29/18    Time  6    Period  Weeks    Status  New      PT LONG TERM GOAL #4   Title  Independent in HEP including appropriate gym program 04/29/18    Time  6    Period  Weeks    Status  New      PT LONG TERM GOAL #5   Title  Improve FOTO to </= 27% limitation 04/29/18    Time  6    Period  Weeks    Status  New            Plan - 03/23/18 1437    Clinical Impression Statement  Rt quad/hamstring remain tight.  ROM and flexibility similar to last assessment.  Pt reported some increase in Rt knee soreness with exercises; reduced with use of vaso at end of session.  Pt making gradual  progress towards goals.     Rehab Potential  Good    PT Frequency  2x / week    PT Duration  6 weeks    PT Treatment/Interventions  Patient/family education;ADLs/Self Care Home Management;Cryotherapy;Electrical Stimulation;Iontophoresis 4mg /ml Dexamethasone;Moist Heat;Ultrasound;Dry needling;Manual techniques;Neuromuscular re-education;Functional mobility training;Therapeutic activities;Therapeutic exercise;Balance training    PT Next Visit Plan  continue progressive ROM for Rt knee extension; strengthening Rt LE: assess leg length difference/pelvic assymetry in future session; modalities as indicated     Consulted and Agree with Plan of Care  Patient       Patient will benefit from skilled therapeutic intervention in order to improve the following deficits and impairments:  Postural dysfunction, Improper body mechanics, Pain, Increased fascial restricitons, Increased muscle spasms, Hypomobility, Decreased mobility, Decreased range of motion, Decreased strength, Decreased activity tolerance, Abnormal gait  Visit Diagnosis: Chronic pain of right knee  Other symptoms and signs involving the musculoskeletal system  Muscle weakness (generalized)     Problem List Patient Active Problem List   Diagnosis Date Noted  . Loose body in knee 06/04/2017  . Leg length discrepancy 06/03/2017   Mayer Camel, PTA 03/23/18 3:00 PM  [facility] [place] [address] [place] [address] [place], [place], [address] Phone: [phone]   Fax:  [phone]  Name: Patrick Bradley MRN: [id number] Date of Birth: 1991/04/08

## 2018-03-23 NOTE — Patient Instructions (Signed)
HIP: Abduction - Side-Lying    Lie on side, legs straight and in line with trunk. Squeeze glutes. Raise top leg up and slightly back. Point toes forward. __10_ reps per set, __2_ sets per day, _3__ days per week Bend bottom leg to stabilize pelvis.   [facility] [address] [place], [place] [address]  [phone] (office) [phone] (fax)

## 2018-03-25 ENCOUNTER — Ambulatory Visit: Payer: BC Managed Care – PPO | Admitting: Physical Therapy

## 2018-03-25 DIAGNOSIS — R29898 Other symptoms and signs involving the musculoskeletal system: Secondary | ICD-10-CM | POA: Diagnosis not present

## 2018-03-25 DIAGNOSIS — M6281 Muscle weakness (generalized): Secondary | ICD-10-CM | POA: Diagnosis not present

## 2018-03-25 DIAGNOSIS — G8929 Other chronic pain: Secondary | ICD-10-CM | POA: Diagnosis not present

## 2018-03-25 DIAGNOSIS — M25561 Pain in right knee: Secondary | ICD-10-CM

## 2018-03-25 NOTE — Patient Instructions (Signed)
Knee Extension Mobilization: Hang (Prone)    With table supporting thighs, place __0__ pound weight on right ankle. Hold __1__ minutes. Repeat __2-3__ times per set. Do __1__ sets per session. Do __1__ sessions per day.

## 2018-03-25 NOTE — Therapy (Signed)
[facility] [place] [address] [place] [address] [place], [place], [address] Phone: [phone]   Fax:  [phone]  Physical Therapy Treatment  Patient Details  Name: Patrick Bradley MRN: [id number] Date of Birth: August 28, 1990 Referring Provider (PT): Dr Clementeen Graham    Encounter Date: 03/25/2018  PT End of Session - 03/25/18 0850    Visit Number  4    Number of Visits  12    Date for PT Re-Evaluation  04/29/18    PT Start Time  0848    PT Stop Time  0943    PT Time Calculation (min)  55 min       No past medical history on file.  Past Surgical History:  Procedure Laterality Date  . KNEE ARTHROPLASTY  2009    There were no vitals filed for this visit.  Subjective Assessment - 03/25/18 0852    Subjective  "I feel looser than I have in a while".       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  No/denies    Pain Score  0-No pain         [facility] PT Assessment - 03/25/18 0001      Assessment   Medical Diagnosis  Rt knee sprain    Referring Provider (PT)  Dr Clementeen Graham     Onset Date/Surgical Date  05/03/17    Hand Dominance  Right    Next MD Visit  PRN    Prior Therapy  none      AROM   Right Knee Extension  -8       [facility] Adult PT Treatment/Exercise - 03/25/18 0001      Self-Care   Self-Care  Heat/Ice Application;Other Self-Care Comments    Heat/Ice Application  pt instructed on how-to and parameters of ice massage to Rt patellar tendon; pt verbalized understanding    Other Self-Care Comments   Pt educated on self massage of hamstring with roller and cross fiber friction to Rt patellar tendon.       Exercises   Exercises  Knee/Hip      Knee/Hip Exercises: Stretches   Passive Hamstring Stretch  Right;2 reps;30 seconds    Quad Stretch  Right;3 reps;30 seconds   prone quad stretch   Quad Stretch  Limitations  noodle above knee     Hip Flexor Stretch  Right;2 reps;30 seconds    Gastroc Stretch  Right;Left;30 seconds;3 reps      Knee/Hip Exercises: Aerobic   Recumbent Bike  L2: 5 min    Other Aerobic  retro walking on treadmill for focus on TKE -0.9 mph x 1.5 min       Knee/Hip Exercises: Standing   Step Down  Left;1 set;10 reps;Hand Hold: 2   3" step, and retro step up for TKE   Step Down Limitations  repeated on 6" step      Knee/Hip Exercises: Seated   Other Seated Knee/Hip Exercises  long sitting:  SLR with RLE x 5, SLR with ER x 5, 2 sets      Knee/Hip Exercises: Sidelying   Hip ABduction  Strengthening;Right;1 set;15 reps      Knee/Hip Exercises: Prone   Prone Knee Hang  --   3 reps of 60 seconds   Other Prone Exercises  terminal Rt knee ext with toes tucked x 10  sec x 5 reps       Vasopneumatic   Number Minutes Vasopneumatic   15 minutes    Vasopnuematic Location   Knee   Rt   Vasopneumatic Pressure  Low    Vasopneumatic Temperature   34 deg       Manual Therapy   Manual therapy comments  pt very ticklish and guarded with palpation to knee and hamstring.              PT Education - 03/25/18 0920    Education Details  HEP - added prone hang    Person(s) Educated  Patient    Methods  Explanation;Handout;Demonstration    Comprehension  Verbalized understanding;Returned demonstration          PT Long Term Goals - 03/18/18 1351      PT LONG TERM GOAL #1   Title  Increase Rt knee ROM by 5-7 degrees 04/29/18    Time  6    Period  Weeks    Status  New      PT LONG TERM GOAL #2   Title  Increase strength Rt LE to 5-/5 to 5/5 throughout 04/29/18    Time  6    Period  Weeks    Status  New      PT LONG TERM GOAL #3   Title  Decrease Rt knee pain by 50-75% allowing patient to participate in normal functional activities with minimal to no pain 04/29/18    Time  6    Period  Weeks    Status  New      PT LONG TERM GOAL #4   Title  Independent in  HEP including appropriate gym program 04/29/18    Time  6    Period  Weeks    Status  New      PT LONG TERM GOAL #5   Title  Improve FOTO to </= 27% limitation 04/29/18    Time  6    Period  Weeks    Status  New            Plan - 03/25/18 0929    Clinical Impression Statement  Continue gains with Rt knee ext ROM.  Pt tolerated new extension exercises well, only reporting increase in Rt knee discomfort with hip abdct and quad stretch.  Pt progressing well towards goals.     Rehab Potential  Good    PT Frequency  2x / week    PT Duration  6 weeks    PT Treatment/Interventions  Patient/family education;ADLs/Self Care Home Management;Cryotherapy;Electrical Stimulation;Iontophoresis 4mg /ml Dexamethasone;Moist Heat;Ultrasound;Dry needling;Manual techniques;Neuromuscular re-education;Functional mobility training;Therapeutic activities;Therapeutic exercise;Balance training    PT Next Visit Plan  continue progressive ROM for Rt knee extension; strengthening Rt LE: assess leg length difference/pelvic assymetry in future session; modalities as indicated     Consulted and Agree with Plan of Care  Patient       Patient will benefit from skilled therapeutic intervention in order to improve the following deficits and impairments:  Postural dysfunction, Improper body mechanics, Pain, Increased fascial restricitons, Increased muscle spasms, Hypomobility, Decreased mobility, Decreased range of motion, Decreased strength, Decreased activity tolerance, Abnormal gait  Visit Diagnosis: Chronic pain of right knee  Other symptoms and signs involving the musculoskeletal system  Muscle weakness (generalized)     Problem List Patient Active Problem List   Diagnosis Date Noted  . Loose body in knee 06/04/2017  . Leg length discrepancy 06/03/2017   Mayer Camel, PTA 03/25/18  10:50 AM  [facility] [address] [place],  [place], [address] Phone: [phone]   Fax:  [phone]  Name: Patrick Bradley MRN: [id number] Date of Birth: [birth year]

## 2018-03-30 ENCOUNTER — Ambulatory Visit: Payer: BC Managed Care – PPO | Admitting: Physical Therapy

## 2018-03-30 DIAGNOSIS — G8929 Other chronic pain: Secondary | ICD-10-CM

## 2018-03-30 DIAGNOSIS — M6281 Muscle weakness (generalized): Secondary | ICD-10-CM | POA: Diagnosis not present

## 2018-03-30 DIAGNOSIS — M25561 Pain in right knee: Secondary | ICD-10-CM

## 2018-03-30 DIAGNOSIS — R29898 Other symptoms and signs involving the musculoskeletal system: Secondary | ICD-10-CM | POA: Diagnosis not present

## 2018-03-30 NOTE — Therapy (Signed)
[place], [place], [address] Phone: [phone]   Fax:  [phone]  Physical Therapy Treatment  Patient Details  Name: Patrick Bradley MRN: [id number] Date of Birth: 09/06/1990 Referring Provider (PT): Dr Lynne Leader    Encounter Date: 03/30/2018  PT End of Session - 03/30/18 1059    PT Start Time  5638    PT Stop Time  1058    PT Time Calculation (min)  43 min    Activity Tolerance  Patient tolerated treatment well;No increased pain    Behavior During Therapy  [facility] for tasks assessed/performed       No past medical history on file.  Past Surgical History:  Procedure Laterality Date  . KNEE ARTHROPLASTY  2009    There were no vitals filed for this visit.  Subjective Assessment - 03/30/18 1017    Subjective  "I must say it feels easier to walk on.".  He reports he is doing his exercises daily.  He "notices a significant improvement in every day things".  He is awaiting his custom knee brace.  States he was told by doctor he could run if he had brace. He would like to return to gym soon.     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  No/denies    Pain Score  0-No pain         [facility] PT Assessment - 03/30/18 0001      Assessment   Medical Diagnosis  Rt knee sprain    Referring Provider (PT)  Dr Lynne Leader     Onset Date/Surgical Date  05/03/17    Hand Dominance  Right    Next MD Visit  PRN    Prior Therapy  none      AROM   Right Knee Extension  -7    Right Knee Flexion  122      Flexibility   Quadriceps  Rt 116 deg        [facility] Adult PT Treatment/Exercise - 03/30/18 0001      Exercises   Exercises  Knee/Hip      Knee/Hip Exercises: Stretches   Passive Hamstring Stretch   Right;2 reps;30 seconds    Quad Stretch  Right;3 reps;30 seconds   prone quad stretch   Quad Stretch Limitations  noodle above knee     Gastroc Stretch  Right;Left;30 seconds;2 reps    Other Knee/Hip Stretches  seated Rt quad stretch with foot under chair x 30 sec x 2 reps (at end of session), Rt hamstring stretch in sitting x 30 sec       Knee/Hip Exercises: Aerobic   Recumbent Bike  L1-3: 5 min      Knee/Hip Exercises: Standing   Step Down  Right;1 set;10 reps;Hand Hold: 2;Step Height: 6"    Wall Squat  1 set;10 reps;5 seconds    Other Standing Knee Exercises  forward leans in Rt SLS to touch chair seat x 5 reps; very challenging      Knee/Hip Exercises: Seated   Other Seated Knee/Hip Exercises  long sitting:  SLR with RLE with hip abdct/add x 10 reps      Knee/Hip Exercises: Prone   Prone Knee Hang  1 minute      Modalities   Modalities  --   pt declined; will use ice at home.      Vasopneumatic   Number Minutes Vasopneumatic   --    Vasopnuematic Location   --    Vasopneumatic Pressure  --    Vasopneumatic Temperature   --      Manual Therapy   Soft tissue mobilization  STM (over clothes) to Rt hamstring                    PT Long Term Goals - 03/30/18 1020      PT LONG TERM GOAL #1   Title  Increase Rt knee ROM by 5-7 degrees 04/29/18    Time  6    Period  Weeks    Status  On-going      PT LONG TERM GOAL #2   Title  Increase strength Rt LE to 5-/5 to 5/5 throughout 04/29/18    Time  6    Period  Weeks    Status  On-going      PT LONG TERM GOAL #3   Title  Decrease Rt knee pain by 50-75% allowing patient to participate in normal functional activities with minimal to no pain 04/29/18    Time  6    Period  Weeks    Status  Achieved      PT LONG TERM GOAL #4   Title  Independent in HEP including appropriate gym program 04/29/18    Time  6    Period  Weeks    Status  On-going      PT LONG TERM GOAL #5   Title  Improve FOTO to </= 27%  limitation 04/29/18    Time  6    Period  Weeks    Status  On-going            Plan - 03/30/18 1020    Clinical Impression Statement  Pt now reporting 60-70% reduction in knee pain since initiating therapy; has met LTG#3.  Rt knee flexion ROM and quad flexibility similar to last assessment. He tolerated all new exercises well, without production of symptoms other than fatigue.      Rehab Potential  Good    PT Frequency  2x / week    PT Duration  6 weeks    PT Treatment/Interventions  Patient/family education;ADLs/Self Care Home Management;Cryotherapy;Electrical Stimulation;Iontophoresis '4mg'$ /ml Dexamethasone;Moist Heat;Ultrasound;Dry needling;Manual techniques;Neuromuscular re-education;Functional mobility training;Therapeutic activities;Therapeutic exercise;Balance training    PT Next Visit Plan  continue progressive ROM for Rt knee extension; strengthening Rt LE.    Consulted and Agree with Plan of Care  Patient       Patient will benefit from skilled therapeutic intervention in order to improve the following deficits and impairments:  Postural dysfunction, Improper body mechanics, Pain, Increased fascial restricitons, Increased muscle spasms, Hypomobility, Decreased mobility, Decreased range of motion, Decreased strength, Decreased activity tolerance, Abnormal gait  Visit Diagnosis: Chronic pain of right knee  Other symptoms and signs involving the musculoskeletal system  Muscle weakness (generalized)     Problem List Patient Active Problem List   Diagnosis Date Noted  . Loose body in knee 06/04/2017  . Leg length discrepancy 06/03/2017   Kerin Perna, PTA 03/30/18 11:01 AM  [place] [facility] [place], [place], [address] Phone: [phone]   Fax:  [phone]  Name: Patrick Bradley MRN: [id number] Date of Birth: November 14, 1990

## 2018-04-01 ENCOUNTER — Ambulatory Visit: Payer: BC Managed Care – PPO | Admitting: Rehabilitative and Restorative Service Providers"

## 2018-04-01 ENCOUNTER — Encounter: Payer: Self-pay | Admitting: Rehabilitative and Restorative Service Providers"

## 2018-04-01 DIAGNOSIS — M6281 Muscle weakness (generalized): Secondary | ICD-10-CM | POA: Diagnosis not present

## 2018-04-01 DIAGNOSIS — R29898 Other symptoms and signs involving the musculoskeletal system: Secondary | ICD-10-CM

## 2018-04-01 DIAGNOSIS — G8929 Other chronic pain: Secondary | ICD-10-CM | POA: Diagnosis not present

## 2018-04-01 DIAGNOSIS — M25561 Pain in right knee: Secondary | ICD-10-CM

## 2018-04-01 NOTE — Patient Instructions (Addendum)
FUNCTIONAL MOBILITY: Lateral Step Up    Standing on step sideways with right leg. Bend right knee and tap heel down to the floor then straighten the right knee back up _10__ reps per set, _1-2__ sets per day. Can repeat leading with other leg.   Functional Quadriceps: Sit to Stand    Sit on edge of chair, feet flat on floor. Stand upright, extending knees fully. Return to sit very slowly.  Repeat __5__ times per set. Do __1-3__ sets per session. Do __1-2__ sessions per day.   Bridging    Slowly raise buttocks from floor, keeping core tight. Repeat __10__ times per set. Do __1-2__ sets per session. Do _1-2___ sessions per day.   Strengthening: Hip Adduction - Isometric    With ball or folded pillow between knees, squeeze knees together. Hold __5__ seconds. Repeat ___10_ times per set. Do __1-3__ sets per session. Do _1-2___ sessions per day.    Strengthening: Hip Abductor - Resisted    With band looped around both legs above knees, push one knee out to the side holding the opposite knee still.  Then repeat with the other knee alternating knees out.  Repeat _10___ times per set. Do _1-2__ sets per session. Do __1-2__ sessions per day.

## 2018-04-01 NOTE — Therapy (Signed)
[facility] [address] [place], [place], [address] Phone: [phone]   Fax:  [phone]  Physical Therapy Treatment  Patient Details  Name: Patrick Bradley MRN: [id number] Date of Birth: Nov 14, 1990 Referring Provider (PT): Dr Clementeen GrahamEvan Corey    Encounter Date: 04/01/2018  PT End of Session - 04/01/18 1021    Visit Number  6    Number of Visits  12    Date for PT Re-Evaluation  04/29/18    PT Start Time  1015    PT Stop Time  1110    PT Time Calculation (min)  55 min    Activity Tolerance  Patient tolerated treatment well       History reviewed. No pertinent past medical history.  Past Surgical History:  Procedure Laterality Date  . KNEE ARTHROPLASTY  2009    There were no vitals filed for this visit.  Subjective Assessment - 04/01/18 1022    Subjective  Knee is feeling better. Less pain - has been pain free for a few days now. He is working on his exercises at home.     Currently in Pain?  No/denies    Pain Score  0-No pain                       [facility] Adult PT Treatment/Exercise - 04/01/18 0001      Knee/Hip Exercises: Stretches   Gastroc Stretch  Right;Left;30 seconds;2 reps    Other Knee/Hip Stretches  seated Rt quad stretch with foot under chair x 30 sec x 2 reps (at end of session), Rt hamstring stretch in sitting x 30 sec       Knee/Hip Exercises: Aerobic   Recumbent Bike  L1-3: 5 min      Knee/Hip Exercises: Standing   Lateral Step Up  Right;2 sets;10 reps;Hand Hold: 1;Step Height: 6"   heel tap down    Step Down  Right;Hand Hold: 2;Step Height: 6";2 sets;10 reps    Wall Squat  1 set;10 reps;5 seconds   green therapy ball at back    Other Standing Knee Exercises  forward leans in Rt SLS to touch matt table x 10 reps; challenging      Knee/Hip Exercises: Seated   Sit to Sand  5 reps;without UE support   slow eccentric lowering to sit      Knee/Hip Exercises: Supine   Quad Sets   AROM;Strengthening;Right;10 reps   Rt heel resting on 4 inch foam roll    Bridges  Strengthening;Both;10 reps   5 sec hold    Other Supine Knee/Hip Exercises  clam alternating LE holding one knee still green TB x 10 each LE       Knee/Hip Exercises: Sidelying   Hip ABduction  Strengthening;Right;2 sets;10 reps   hips rolled forward leading up with heel      Knee/Hip Exercises: Prone   Other Prone Exercises  terminal Rt knee ext toes resting on surface x 10 reps x 10 sec hold      Vasopneumatic   Number Minutes Vasopneumatic   15 minutes    Vasopnuematic Location   Knee   Rt    Vasopneumatic Pressure  Low    Vasopneumatic Temperature   34 deg       Manual Therapy   Passive ROM  PROM/stretching into knee extension with minimal to moderate overpressure byt PT  10 sec x 3 reps  PT Education - 04/01/18 1054    Education Details  HEP     Person(s) Educated  Patient    Methods  Explanation;Demonstration;Tactile cues;Verbal cues;Handout    Comprehension  Verbalized understanding;Returned demonstration;Verbal cues required;Tactile cues required          PT Long Term Goals - 03/30/18 1020      PT LONG TERM GOAL #1   Title  Increase Rt knee ROM by 5-7 degrees 04/29/18    Time  6    Period  Weeks    Status  On-going      PT LONG TERM GOAL #2   Title  Increase strength Rt LE to 5-/5 to 5/5 throughout 04/29/18    Time  6    Period  Weeks    Status  On-going      PT LONG TERM GOAL #3   Title  Decrease Rt knee pain by 50-75% allowing patient to participate in normal functional activities with minimal to no pain 04/29/18    Time  6    Period  Weeks    Status  Achieved      PT LONG TERM GOAL #4   Title  Independent in HEP including appropriate gym program 04/29/18    Time  6    Period  Weeks    Status  On-going      PT LONG TERM GOAL #5   Title  Improve FOTO to </= 27% limitation 04/29/18    Time  6    Period  Weeks    Status  On-going             Plan - 04/01/18 1022    Clinical Impression Statement  Progressing well with decreased pain and increased functional activity level and exercise tolerance.     Rehab Potential  Good    PT Frequency  2x / week    PT Duration  6 weeks    PT Treatment/Interventions  Patient/family education;ADLs/Self Care Home Management;Cryotherapy;Electrical Stimulation;Iontophoresis 4mg /ml Dexamethasone;Moist Heat;Ultrasound;Dry needling;Manual techniques;Neuromuscular re-education;Functional mobility training;Therapeutic activities;Therapeutic exercise;Balance training    PT Next Visit Plan  continue progressive ROM for Rt knee extension; strengthening Rt LE.    Consulted and Agree with Plan of Care  Patient       Patient will benefit from skilled therapeutic intervention in order to improve the following deficits and impairments:  Postural dysfunction, Improper body mechanics, Pain, Increased fascial restricitons, Increased muscle spasms, Hypomobility, Decreased mobility, Decreased range of motion, Decreased strength, Decreased activity tolerance, Abnormal gait  Visit Diagnosis: Chronic pain of right knee  Other symptoms and signs involving the musculoskeletal system  Muscle weakness (generalized)     Problem List Patient Active Problem List   Diagnosis Date Noted  . Loose body in knee 06/04/2017  . Leg length discrepancy 06/03/2017    Celyn Rober Minion PT, MPH  04/01/2018, 11:04 AM  [facility] [address] [place], [place], [address] Phone: [phone]   Fax:  [phone]  Name: Patrick Bradley MRN: [id number] Date of Birth: 10-16-1990

## 2018-04-07 ENCOUNTER — Ambulatory Visit: Payer: BC Managed Care – PPO | Admitting: Physical Therapy

## 2018-04-07 ENCOUNTER — Encounter: Payer: Self-pay | Admitting: Physical Therapy

## 2018-04-07 DIAGNOSIS — R29898 Other symptoms and signs involving the musculoskeletal system: Secondary | ICD-10-CM | POA: Diagnosis not present

## 2018-04-07 DIAGNOSIS — G8929 Other chronic pain: Secondary | ICD-10-CM | POA: Diagnosis not present

## 2018-04-07 DIAGNOSIS — M6281 Muscle weakness (generalized): Secondary | ICD-10-CM | POA: Diagnosis not present

## 2018-04-07 DIAGNOSIS — M25561 Pain in right knee: Secondary | ICD-10-CM | POA: Diagnosis not present

## 2018-04-07 NOTE — Therapy (Signed)
[place] [address] [place], [place], [address] Phone: [phone]   Fax:  [phone]  Physical Therapy Treatment  Patient Details  Name: Patrick Bradley MRN: [id number] Date of Birth: 07/28/1990 Referring Provider (PT): Dr Lynne Leader    Encounter Date: 04/07/2018  PT End of Session - 04/07/18 0942    Visit Number  7    Number of Visits  12    Date for PT Re-Evaluation  04/29/18    PT Start Time  0935    PT Stop Time  1018    PT Time Calculation (min)  43 min    Activity Tolerance  Patient tolerated treatment well;No increased pain    Behavior During Therapy  [facility] for tasks assessed/performed       History reviewed. No pertinent past medical history.  Past Surgical History:  Procedure Laterality Date  . KNEE ARTHROPLASTY  2009    There were no vitals filed for this visit.  Subjective Assessment - 04/07/18 0936    Subjective  Pt reports his knee brace arrived in mail last week. He used is a few times so far, once while attending a chicken stew party.  He woke up in the night once to pain in knee (2 nights ago); pain resolved quickly, "I probably was sleeping on it funny".       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  No/denies    Pain Score  0-No pain         [facility] PT Assessment - 04/07/18 0001      Assessment   Medical Diagnosis  Rt knee sprain    Referring Provider (PT)  Dr Lynne Leader     Onset Date/Surgical Date  05/03/17    Hand Dominance  Right    Next MD Visit  PRN    Prior Therapy  none      AROM   Right Knee Extension  -7    Right Knee Flexion  125      Strength   Right Hip Extension  5/5    Right Hip ABduction  --   5-/5   Right Hip ADduction  --   5-/5   Left Hip Extension  5/5    Right Knee Flexion  5/5      Flexibility   Quadriceps  Rt 121 deg         [facility] Adult PT Treatment/Exercise - 04/07/18 0001      Knee/Hip Exercises: Stretches   Passive Hamstring Stretch  Right;2 reps;30 seconds    Quad Stretch  Right;3 reps;30 seconds   prone quad stretch   Gastroc Stretch  Both;3 reps;30 seconds   slant board     Knee/Hip Exercises: Aerobic   Recumbent Bike  L3: 5 min      Knee/Hip Exercises: Standing   Heel Raises  Right;1 set;5 reps   for HEP   Step Down  Left;2 sets;10 reps;Hand Hold: 1;Step Height: 6";Step Height: 8"   retro step up   Other Standing Knee Exercises  forward leans in Rt SLS to touch seat of chair x 10 reps; challenging      Knee/Hip Exercises: Supine   Bridges  Strengthening;Both;10 reps   5 sec hold, green band at thigh    Other Supine Knee/Hip Exercises  clam alternating LE holding one  knee still green TB x 10 each LE       Knee/Hip Exercises: Sidelying   Hip ADduction  Strengthening;Right;Left;1 set;10 reps   foot on chair, other leg lifting to chair height     Knee/Hip Exercises: Prone   Other Prone Exercises  terminal Rt knee ext toes resting on surface x 10 reps x 10 sec hold      Modalities   Modalities  --   pt declined; will use ice at home.      Manual Therapy   Passive ROM  PROM/stretching into knee extension with minimal to moderate overpressure byt PT  10 sec x 3 reps              PT Education - 04/07/18 1043    Education Details  HEP - add single leg heel raise    Person(s) Educated  Patient    Methods  Explanation;Demonstration    Comprehension  Verbalized understanding;Returned demonstration          PT Long Term Goals - 04/07/18 1054      PT LONG TERM GOAL #1   Title  Increase Rt knee ROM by 5-7 degrees 04/29/18    Time  6    Period  Weeks    Status  On-going      PT LONG TERM GOAL #2   Title  Increase strength Rt LE to 5-/5 to 5/5 throughout 04/29/18    Time  6    Period  Weeks    Status  Partially Met      PT LONG TERM GOAL #3   Title  Decrease Rt knee  pain by 50-75% allowing patient to participate in normal functional activities with minimal to no pain 04/29/18    Time  6    Period  Weeks    Status  Achieved      PT LONG TERM GOAL #4   Title  Independent in HEP including appropriate gym program 04/29/18    Time  6    Period  Weeks    Status  On-going      PT LONG TERM GOAL #5   Title  Improve FOTO to </= 27% limitation 04/29/18    Time  6    Period  Weeks    Status  On-going            Plan - 04/07/18 0947    Clinical Impression Statement  Pt demonstrated improved control and balance with forward leans.  He was able to ascend (retro) 8" step without difficulty.  Improved Rt quad flexibility. Gradual progression of Rt knee ROM, Improvement in RLE strength. He verbalized desire to continue PT for 2 additional visits prior to d/c.    Rehab Potential  Good    PT Frequency  2x / week    PT Duration  6 weeks    PT Treatment/Interventions  Patient/family education;ADLs/Self Care Home Management;Cryotherapy;Electrical Stimulation;Iontophoresis '4mg'$ /ml Dexamethasone;Moist Heat;Ultrasound;Dry needling;Manual techniques;Neuromuscular re-education;Functional mobility training;Therapeutic activities;Therapeutic exercise;Balance training    PT Next Visit Plan  continue progressive ROM for Rt knee extension; strengthening Rt LE.    Consulted and Agree with Plan of Care  Patient       Patient will benefit from skilled therapeutic intervention in order to improve the following deficits and impairments:  Postural dysfunction, Improper body mechanics, Pain, Increased fascial restricitons, Increased muscle spasms, Hypomobility, Decreased mobility, Decreased range of motion, Decreased strength, Decreased activity tolerance, Abnormal gait  Visit Diagnosis: Chronic pain of right knee  Muscle weakness (generalized)  Other symptoms and signs involving the musculoskeletal system     Problem List Patient Active Problem List   Diagnosis Date  Noted  . Loose body in knee 06/04/2017  . Leg length discrepancy 06/03/2017   Kerin Perna, PTA 04/07/18 10:57 AM  [facility] [place], [place], [address] Phone: [phone]   Fax:  [phone]  Name: Patrick Bradley MRN: [id number] Date of Birth: [birth year]

## 2018-04-09 ENCOUNTER — Encounter: Payer: BC Managed Care – PPO | Admitting: Rehabilitative and Restorative Service Providers"

## 2018-04-14 ENCOUNTER — Ambulatory Visit: Payer: BC Managed Care – PPO | Admitting: Physical Therapy

## 2018-04-14 ENCOUNTER — Encounter: Payer: Self-pay | Admitting: Physical Therapy

## 2018-04-14 DIAGNOSIS — M6281 Muscle weakness (generalized): Secondary | ICD-10-CM

## 2018-04-14 DIAGNOSIS — G8929 Other chronic pain: Secondary | ICD-10-CM

## 2018-04-14 DIAGNOSIS — M25561 Pain in right knee: Secondary | ICD-10-CM | POA: Diagnosis not present

## 2018-04-14 NOTE — Therapy (Signed)
[place], [place], [address] Phone: [phone]   Fax:  [phone]  Physical Therapy Treatment  Patient Details  Name: Patrick Bradley MRN: [id number] Date of Birth: 1991/03/09 Referring Provider (PT): Dr Lynne Leader    Encounter Date: 04/14/2018  PT End of Session - 04/14/18 0933    Visit Number  8    Number of Visits  12    Date for PT Re-Evaluation  04/29/18    PT Start Time  0930    PT Stop Time  1012    PT Time Calculation (min)  42 min    Activity Tolerance  Patient tolerated treatment well    Behavior During Therapy  [facility] for tasks assessed/performed       History reviewed. No pertinent past medical history.  Past Surgical History:  Procedure Laterality Date  . KNEE ARTHROPLASTY  2009    There were no vitals filed for this visit.  Subjective Assessment - 04/14/18 0933    Subjective  Pt reports no new changes since last visit.     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  No/denies    Pain Score  0-No pain         [facility] PT Assessment - 04/14/18 0001      Assessment   Medical Diagnosis  Rt knee sprain    Referring Provider (PT)  Dr Lynne Leader     Onset Date/Surgical Date  05/03/17    Hand Dominance  Right    Next MD Visit  PRN    Prior Therapy  none      AROM   Right Knee Extension  -6    Right Knee Flexion  125      Flexibility   Hamstrings  Rt 67 deg; Lt 58 deg    Quadriceps  Rt 120; Lt 133         [facility] Adult PT Treatment/Exercise - 04/14/18 0001      Knee/Hip Exercises: Stretches   Passive Hamstring Stretch  Right;Left;2 reps;30 seconds    Quad Stretch  Right;2 reps;60 seconds;Left;30 seconds   prone quad stretch   Gastroc Stretch  Both;3 reps;30 seconds   slant board     Knee/Hip Exercises: Aerobic   Recumbent Bike  L3: 5 min       Knee/Hip Exercises: Standing   Step Down  Left;10 reps;Hand Hold: 1;Step Height: 6";1 set   retro step up   Step Down Limitations  slow eccentric focus with TKE at end.     Other Standing Knee Exercises  forward leans in Rt/ SLS to touch mat table x 10 reps; improving;  Single leg squat with opp leg sliding out into abdct then sliding into ext (foot on paper towel) x 5 reps RLE, 2 reps LLE (fatigues quickly on RLE; cues for knee alignment)      Knee/Hip Exercises: Seated   Sit to Sand  10 reps;without UE support   low black mat, Lt foot forward     Knee/Hip Exercises: Supine   Other Supine Knee/Hip Exercises  Long sitting - Rt SLR with hip abdct/add x 5 reps       Knee/Hip Exercises: Prone   Other Prone Exercises  terminal Rt knee ext toes resting on surface x 10 reps x 10 sec hold  Modalities   Modalities  --   pt declined; will use ice at home, if needed.      Manual Therapy   Passive ROM  PROM/stretching into knee extension with minimal to moderate overpressure byt PT  10 sec x 3 reps                   PT Long Term Goals - 04/14/18 1256      PT LONG TERM GOAL #1   Title  Increase Rt knee ROM by 5-7 degrees 04/29/18    Time  6    Status  Partially Met      PT LONG TERM GOAL #2   Title  Increase strength Rt LE to 5-/5 to 5/5 throughout 04/29/18    Time  6    Period  Weeks    Status  Partially Met      PT LONG TERM GOAL #3   Title  Decrease Rt knee pain by 50-75% allowing patient to participate in normal functional activities with minimal to no pain 04/29/18    Time  6    Period  Weeks    Status  Achieved      PT LONG TERM GOAL #4   Title  Independent in HEP including appropriate gym program 04/29/18    Time  6    Period  Weeks    Status  On-going      PT LONG TERM GOAL #5   Title  Improve FOTO to </= 27% limitation 04/29/18    Time  6    Period  Weeks    Status  On-going            Plan - 04/14/18 1254    Clinical Impression  Statement  Pt's Rt knee ROM similar to last assessment. He tolerated new exercises with increased difficulty, without any increase in pain or symptoms. Pt has partially met LTG#1 and is making good gains towards remaining goals.     Rehab Potential  Good    PT Frequency  2x / week    PT Duration  6 weeks    PT Treatment/Interventions  Patient/family education;ADLs/Self Care Home Management;Cryotherapy;Electrical Stimulation;Iontophoresis '4mg'$ /ml Dexamethasone;Moist Heat;Ultrasound;Dry needling;Manual techniques;Neuromuscular re-education;Functional mobility training;Therapeutic activities;Therapeutic exercise;Balance training    PT Next Visit Plan  assess readiness for d/c.     Consulted and Agree with Plan of Care  Patient       Patient will benefit from skilled therapeutic intervention in order to improve the following deficits and impairments:  Postural dysfunction, Improper body mechanics, Pain, Increased fascial restricitons, Increased muscle spasms, Hypomobility, Decreased mobility, Decreased range of motion, Decreased strength, Decreased activity tolerance, Abnormal gait  Visit Diagnosis: Chronic pain of right knee  Muscle weakness (generalized)     Problem List Patient Active Problem List   Diagnosis Date Noted  . Loose body in knee 06/04/2017  . Leg length discrepancy 06/03/2017   Kerin Perna, PTA 04/14/18 12:58 PM  [place], [place], [address] Phone: [phone]   Fax:  [phone]  Name: Orlen Leedy MRN: [id number] Date of Birth: 01-Aug-1990

## 2018-04-21 ENCOUNTER — Ambulatory Visit (INDEPENDENT_AMBULATORY_CARE_PROVIDER_SITE_OTHER): Payer: BC Managed Care – PPO | Admitting: Physical Therapy

## 2018-04-21 ENCOUNTER — Encounter: Payer: Self-pay | Admitting: Physical Therapy

## 2018-04-21 DIAGNOSIS — M6281 Muscle weakness (generalized): Secondary | ICD-10-CM

## 2018-04-21 DIAGNOSIS — M25561 Pain in right knee: Secondary | ICD-10-CM

## 2018-04-21 DIAGNOSIS — R29898 Other symptoms and signs involving the musculoskeletal system: Secondary | ICD-10-CM | POA: Diagnosis not present

## 2018-04-21 DIAGNOSIS — G8929 Other chronic pain: Secondary | ICD-10-CM | POA: Diagnosis not present

## 2018-04-21 NOTE — Therapy (Addendum)
[place], [place], [address] Phone: [phone]   Fax:  [phone]  Physical Therapy Treatment  Patient Details  Name: Patrick Bradley MRN: [id number] Date of Birth: 1991/05/17 Referring Provider (PT): Dr Lynne Leader    Encounter Date: 04/21/2018  PT End of Session - 04/21/18 0947    Visit Number  9    Number of Visits  12    Date for PT Re-Evaluation  04/29/18    PT Start Time  0933    PT Stop Time  1011    PT Time Calculation (min)  38 min    Activity Tolerance  Patient tolerated treatment well    Behavior During Therapy  [facility] for tasks assessed/performed       History reviewed. No pertinent past medical history.  Past Surgical History:  Procedure Laterality Date  . KNEE ARTHROPLASTY  2009    There were no vitals filed for this visit.  Subjective Assessment - 04/21/18 0941    Subjective  Pt reports his knee popped at work last Friday and it was painful (4-5/10) until the next day.  He didn't do anything for the pain, just waited it out.  He has been wearing new knee brace a little, but not consistently.  He'd like to try running.  He is happy with his current HEP.      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  No/denies    Pain Score  0-No pain    Pain Location  Knee         [facility] PT Assessment - 04/21/18 0001      Assessment   Medical Diagnosis  Rt knee sprain    Referring Provider (PT)  Dr Lynne Leader     Onset Date/Surgical Date  05/03/17    Hand Dominance  Right    Next MD Visit  PRN    Prior Therapy  none      Observation/Other Assessments   Focus on Therapeutic Outcomes (FOTO)   11% limited.       AROM   Right Knee Extension  -6    Right Knee Flexion  125      Strength   Right Ankle Plantar Flexion  5/5   20 heel raises       [facility]  Adult PT Treatment/Exercise - 04/21/18 0001      Exercises   Exercises  Knee/Hip      Knee/Hip Exercises: Stretches   Passive Hamstring Stretch  Right;Left;2 reps;30 seconds    Passive Hamstring Stretch Limitations  seated x 1 rep    Quad Stretch  Right;2 reps;60 seconds;Left;30 seconds   prone quad stretch   Quad Stretch Limitations  seated x 1 rep, 30 sec     Gastroc Stretch  Both;3 reps;30 seconds   slant board     Knee/Hip Exercises: Aerobic   Tread Mill  Total time:  3 min walking at 2.5-3.0 mph, 2 min jogging at 4.1--4.3 mph (walk jog progression of jog for 1 min and walk 1 min x 2 reps     Recumbent Bike  L3: 3 min       Knee/Hip Exercises: Standing   Heel Raises  Right;1 set;20 reps    Step Down  Left;10 reps;Hand Hold: 1;1 set;Step Height: 8"   retro step up  Step Down Limitations  slow eccentric focus with TKE at end.     Other Standing Knee Exercises  single leg squat with leg slldiing out to the side x 10 on Rt, 5 on Lt.       Knee/Hip Exercises: Seated   Sit to Sand  10 reps;without UE support   low black mat, Lt foot forward       PT Long Term Goals - 04/21/18 1004      PT LONG TERM GOAL #1   Title  Increase Rt knee ROM by 5-7 degrees 04/29/18    Time  6    Period  Weeks    Status  Partially Met   7 deg gain in ext, 2 deg in flexion      PT LONG TERM GOAL #2   Title  Increase strength Rt LE to 5-/5 to 5/5 throughout 04/29/18    Time  6    Period  Weeks    Status  Achieved      PT LONG TERM GOAL #3   Title  Decrease Rt knee pain by 50-75% allowing patient to participate in normal functional activities with minimal to no pain 04/29/18    Time  6    Period  Weeks    Status  Achieved      PT LONG TERM GOAL #4   Title  Independent in HEP including appropriate gym program 04/29/18    Time  6    Period  Weeks    Status  Achieved      PT LONG TERM GOAL #5   Title  Improve FOTO to </= 27% limitation 04/29/18    Time  6    Period  Weeks    Status   Achieved            Plan - 04/21/18 1132    Clinical Impression Statement  Pt tolerated all exercises well, including short walk/jog trial, without any increase in symptoms.  His FOTO score improved to 11% limited.  He has met most of his goals and verbalized readiness to d/c to HEP at this time.     Rehab Potential  Good    PT Frequency  2x / week    PT Duration  6 weeks    PT Treatment/Interventions  Patient/family education;ADLs/Self Care Home Management;Cryotherapy;Electrical Stimulation;Iontophoresis '4mg'$ /ml Dexamethasone;Moist Heat;Ultrasound;Dry needling;Manual techniques;Neuromuscular re-education;Functional mobility training;Therapeutic activities;Therapeutic exercise;Balance training    PT Next Visit Plan  spoke with supervising PT; will d/c to HEP at this time.     Consulted and Agree with Plan of Care  Patient       Patient will benefit from skilled therapeutic intervention in order to improve the following deficits and impairments:  Postural dysfunction, Improper body mechanics, Pain, Increased fascial restricitons, Increased muscle spasms, Hypomobility, Decreased mobility, Decreased range of motion, Decreased strength, Decreased activity tolerance, Abnormal gait  Visit Diagnosis: Chronic pain of right knee  Muscle weakness (generalized)  Other symptoms and signs involving the musculoskeletal system     Problem List Patient Active Problem List   Diagnosis Date Noted  . Loose body in knee 06/04/2017  . Leg length discrepancy 06/03/2017   Kerin Perna, PTA 04/21/18 11:36 AM  [facility] [place], [place], [address] Phone: [phone]   Fax:  [phone]  Name: Patrick Bradley MRN: [id number] Date of Birth: [birth year]  PHYSICAL THERAPY DISCHARGE SUMMARY  Visits from Start of Care: 9  Current functional level related  to goals / functional outcomes: See progress note for  discharge status   Remaining deficits: Continued limited knee extension needs to continue with consistent HEP    Education / Equipment: HEP  Plan: Patient agrees to discharge.  Patient goals were partially met. Patient is being discharged due to being pleased with the current functional level.  ?????     Celyn P. Helene Kelp PT, MPH 04/30/18 3:51 PM

## 2018-04-22 ENCOUNTER — Encounter: Payer: BC Managed Care – PPO | Admitting: Rehabilitative and Restorative Service Providers"

## 2019-07-30 IMAGING — MR MR KNEE*R* W/O CM
7 series · 40 of 40 positions shown · non-contrast
Comparison: Plain films right knee 05/25/2017.

CLINICAL DATA: Right knee pain for 6 months. The patient has a
history of prior right knee surgery for a benign tumor. Pain with
motion.

EXAM:
MRI OF THE RIGHT KNEE WITHOUT CONTRAST
TECHNIQUE: Multiplanar, multisequence MR imaging of the knee was performed. No
intravenous contrast was administered.

[Series 3: T2 fat-sat · axial · 4.0mm · 0.53mm/px · z∈[-80,+100]mm · 7 of 37 slices shown (1 of 3)]
[im 1/37]
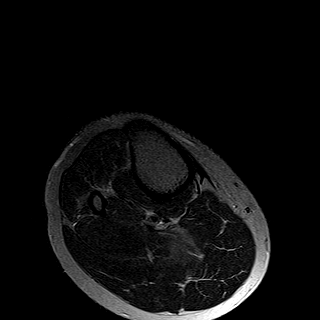
[im 7/37]
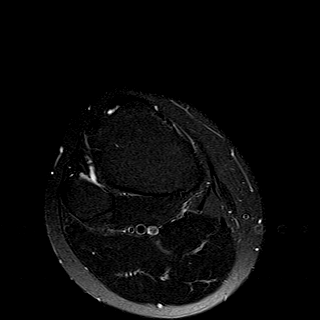
[im 13/37]
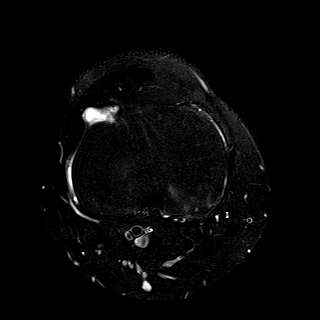
[im 19/37]
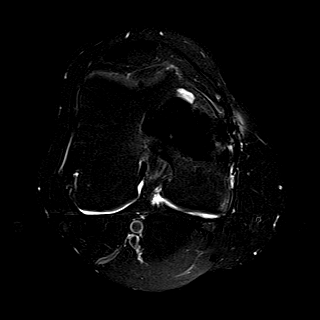
[im 25/37]
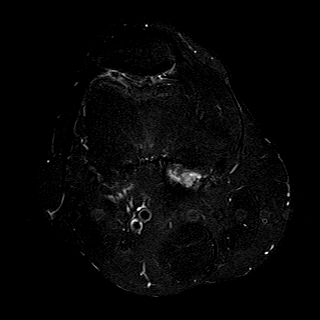
[im 31/37]
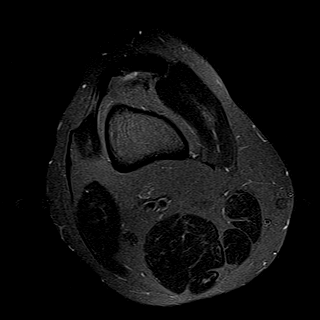
[im 37/37]
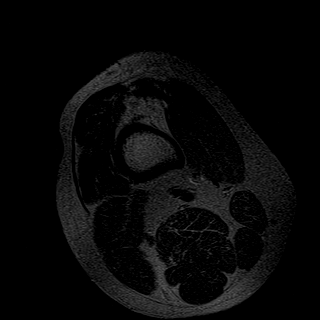

[Series 4: T1 · coronal · 4.0mm · 0.62mm/px · 6 of 33 slices shown]
[im 1/33]
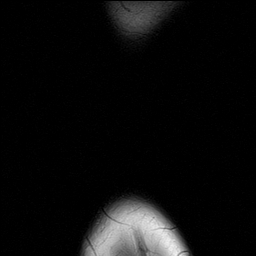
[im 7/33]
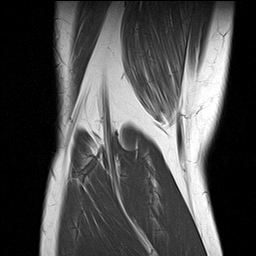
[im 13/33]
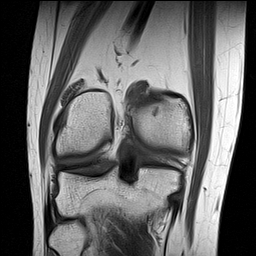
[im 20/33]
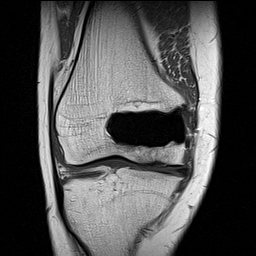
[im 26/33]
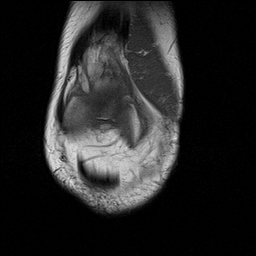
[im 33/33]
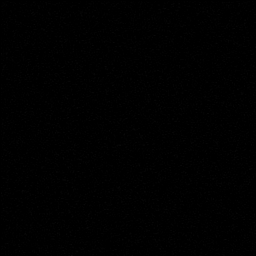

[Series 5: PD fat-sat · coronal · 4.0mm · 0.62mm/px · 6 of 32 slices shown (1 of 3)]
[im 1/32]
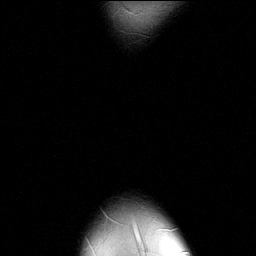
[im 7/32]
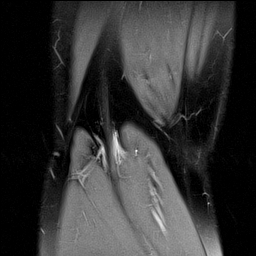
[im 13/32]
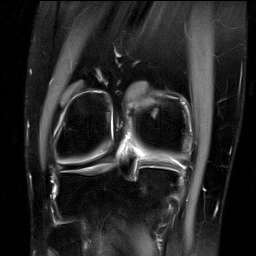
[im 19/32]
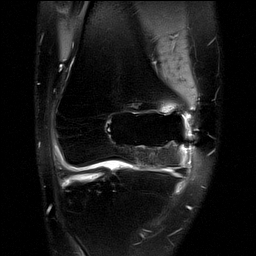
[im 25/32]
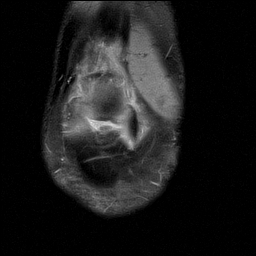
[im 32/32]
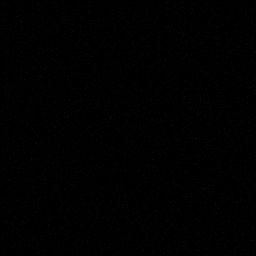

[Series 6: T2 fat-sat · coronal · 4.0mm · 0.62mm/px · 6 of 33 slices shown (2 of 3)]
[im 1/33]
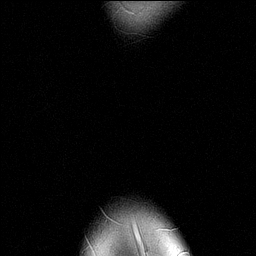
[im 7/33]
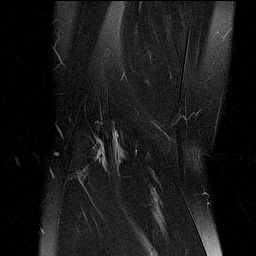
[im 13/33]
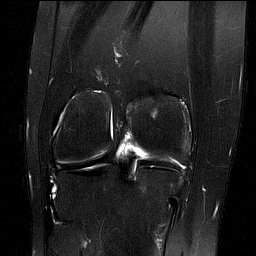
[im 20/33]
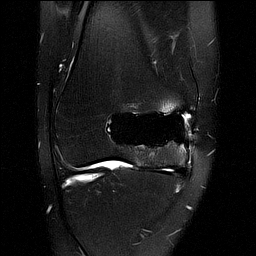
[im 26/33]
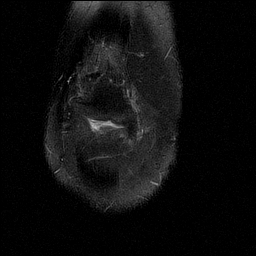
[im 33/33]
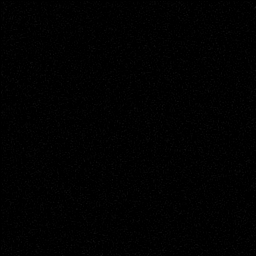

[Series 7: PD fat-sat · sagittal · 3.0mm · 0.62mm/px · 6 of 35 slices shown (2 of 3)]
[im 1/35]
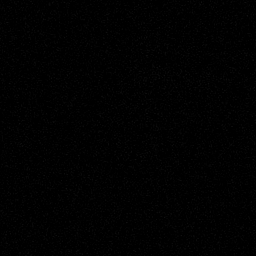
[im 7/35]
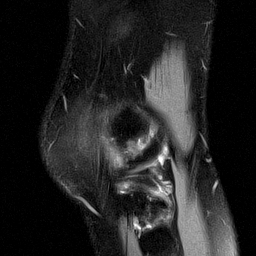
[im 14/35]
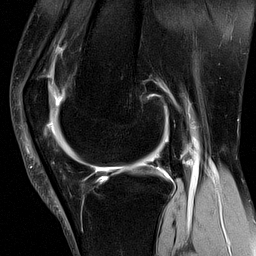
[im 21/35]
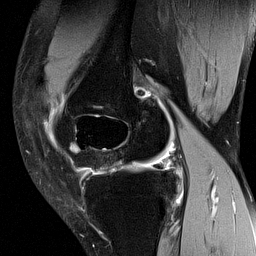
[im 28/35]
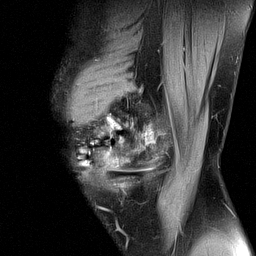
[im 35/35]
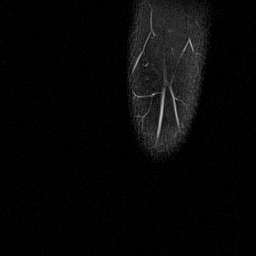

[Series 8: T2 fat-sat · sagittal · 3.0mm · 0.62mm/px · 6 of 35 slices shown (3 of 3)]
[im 1/35]
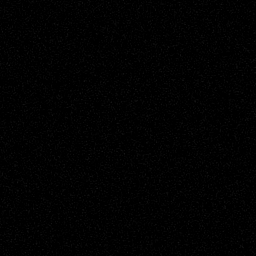
[im 7/35]
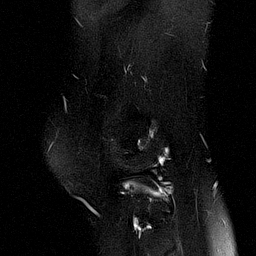
[im 14/35]
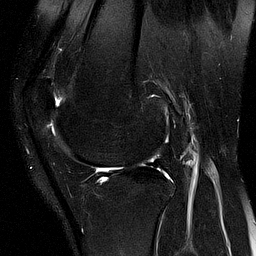
[im 21/35]
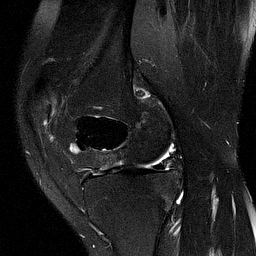
[im 28/35]
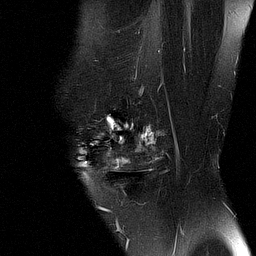
[im 35/35]
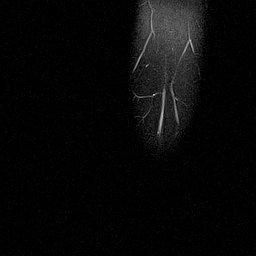

[Series 9: PD fat-sat · oblique · 2.0mm · 0.62mm/px · 3 of 19 slices shown (3 of 3)]
[im 1/19]
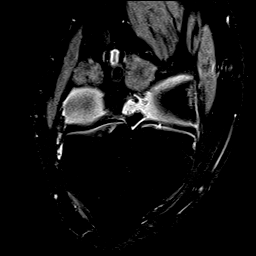
[im 10/19]
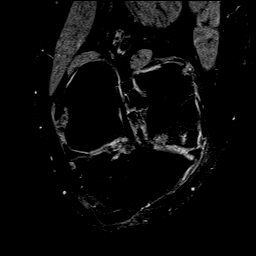
[im 19/19]
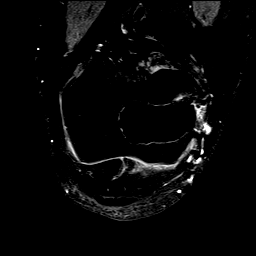

[40 of 40 positions shown; findings below may reference images not displayed]

FINDINGS: MENISCI

Medial meniscus:  Intact.

Lateral meniscus:  Intact.

LIGAMENTS

Cruciates:  Intact.

Collaterals:  Intact.

CARTILAGE

Patellofemoral:  Preserved.

Medial: Marked cartilage thinning and irregularity are seen along
the weight-bearing medial femoral condyle measuring approximately
3.3 cm AP by 1.6 cm transverse. Flattening and irregularity of the
underlying subchondral bone plate are identified and there is
associated subchondral edema.

Lateral:  Preserved.

Joint: No effusion. Loose body in the patellofemoral compartment
seen on the prior plain films is not visible on this examination.
There is a 0.5 cm in diameter loose body projecting superior to the
posterior aspect of the medial femoral condyle.

Popliteal Fossa:  No Baker's cyst.

Extensor Mechanism:  Intact.

Bones: Area of signal dropout in the medial femoral condyle
consistent curettage and packing a benign bone tumor is identified.
Small osteophytes are present about the knee.

Other: None.
IMPRESSION: Negative for meniscal or ligament tear.

Cartilage loss with flattening and irregularity of the subchondral
bone plate and subchondral edema in the medial femoral condyle are
likely related to the patient's bone tumor.

Loose body in the patellofemoral compartment seen on the prior plain
films is not visualized on this exam. Small loose body superior to
the posterior aspect of the medial femoral condyle is identified.

Status post curettage and packing of a lesion in the medial femoral
condyle.

## 2024-02-23 ENCOUNTER — Ambulatory Visit
Admission: EM | Admit: 2024-02-23 | Discharge: 2024-02-23 | Disposition: A | Discharge status: Home / Self Care | Attending: Family Medicine | Admitting: Family Medicine

## 2024-02-23 ENCOUNTER — Other Ambulatory Visit: Payer: Self-pay

## 2024-02-23 DIAGNOSIS — J069 Acute upper respiratory infection, unspecified: Secondary | ICD-10-CM | POA: Diagnosis not present

## 2024-02-23 MED ORDER — FLUTICASONE PROPIONATE 50 MCG/ACT NA SUSP: 2.0000 | Freq: Every day | NASAL | 0 refills | Status: AC

## 2024-02-23 NOTE — ED Provider Notes (Signed)
 [facility]    CSN: [id number] Arrival date & time: 02/23/24  0809      History   Chief Complaint Chief Complaint  Patient presents with   Cough    HPI Patrick Bradley is a 33 y.o. male presenting with 1 week of cough.  No pertinent past medical history.  Non-smoker. Cough worse in the evening, better in the morning.  Feels nasal drip, nasal congestion.  No other systemic symptoms.  Reports Home COVID test negative.  Using Claritin at home.  History reviewed. No pertinent past medical history.  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 Authorizing Provider  fluticasone (FLONASE) 50 MCG/ACT nasal spray Place 2 sprays into both nostrils daily. 02/23/24 02/22/25 Yes Howell Lunger, DO  diclofenac  sodium (VOLTAREN ) 1 % GEL Apply 4 g topically 4 (four) times daily. To affected joint. 03/12/18   Joane Artist RAMAN, MD    Family History History reviewed. No pertinent family history.  Social History Social History   Tobacco Use   Smoking status: Never   Smokeless tobacco: Never  Substance Use Topics   Alcohol use: No   Drug use: No     Allergies   Bee pollen and Cat dander   Review of Systems Review of Systems   Physical Exam Triage Vital Signs ED Triage Vitals [02/23/24 0817]  Encounter Vitals Group     BP 120/80     Girls Systolic BP Percentile      Girls Diastolic BP Percentile      Boys Systolic BP Percentile      Boys Diastolic BP Percentile      Pulse Rate 69     Resp 16     Temp 97.8 F (36.6 C)     Temp src      SpO2 96 %     Weight      Height      Head Circumference      Peak Flow      Pain Score      Pain Loc      Pain Education      Exclude from Growth Chart    No  data found.  Updated Vital Signs BP 120/80   Pulse 69   Temp 97.8 F (36.6 C)   Resp 16   SpO2 96%   Visual Acuity Right Eye Distance:   Left Eye Distance:   Bilateral Distance:    Right Eye Near:   Left Eye Near:    Bilateral Near:     Physical Exam HENT:     Head: Normocephalic and atraumatic.     Nose: Congestion present.     Mouth/Throat:     Mouth: Mucous membranes are moist.     Pharynx: No oropharyngeal exudate.     Comments: Post-nasal drip Eyes:     Extraocular Movements: Extraocular movements intact.     Conjunctiva/sclera: Conjunctivae normal.     Pupils: Pupils are equal, round, and reactive to light.  Cardiovascular:     Rate and Rhythm: Normal rate and regular rhythm.     Heart sounds: Normal heart sounds.  Pulmonary:     Effort: Pulmonary effort is normal.  Breath sounds: Normal breath sounds.  Abdominal:     General: Abdomen is flat. Bowel sounds are normal.     Palpations: Abdomen is soft.  Musculoskeletal:     Cervical back: Normal range of motion.  Lymphadenopathy:     Cervical: No cervical adenopathy.  Skin:    General: Skin is warm and dry.      UC Treatments / Results  Labs (all labs ordered are listed, but only abnormal results are displayed) Labs Reviewed - No data to display  EKG   Radiology No results found.  Procedures Procedures (including critical care time)  Medications Ordered in UC Medications - No data to display  Initial Impression / Assessment and Plan / UC Course  I have reviewed the triage vital signs and the nursing notes.  Pertinent labs & imaging results that were available during my care of the patient were reviewed by me and considered in my medical decision making (see chart for details).     Well-appearing, afebrile.  Benign physical exam.  Given length of cough, with some improvement strongly suspect viral URI and postviral cough. Differential includes allergic rhinitis. Plan for supportive care.   Follow-up and return precautions discussed.  Final Clinical Impressions(s) / UC Diagnoses   Final diagnoses:  Viral URI with cough     Discharge Instructions      -Please use 2 sprays of flonase in each nostril one to two times daily for the next 7 to 14 days until symptoms improve - Return to care if you develop fevers, productive cough, or no improvement in the next 1 to 2 weeks  Upper respiratory tract infections cause symptoms such as nasal congestion, runny nose, sore throat, cough, and general malaise. Typically these are viral.  Here are some evidence-based recommendations for managing the common cold at home:  Over-the-Counter Medications 1. Pain Relievers: Acetaminophen (Tylenol) or nonsteroidal anti-inflammatory drugs (NSAIDs) like ibuprofen (Advil) can help reduce fever, sore throat, and body aches. 2. Decongestants: Pseudoephedrine (Sudafed) and phenylephrine can help relieve nasal congestion. These should be used with caution and not for more than three days to avoid rebound congestion. Please do not use these medications if you have high blood pressure or a heart condition. DO NOT USE IN CHILDREN. 3. Antihistamines: Like Zyrtec, combined with decongestants can modestly improve symptoms in adults. Do not take Claritin-D if you have a heart condition. 4. Cough Suppressants: Dextromethorphan may help reduce cough in adults, but its effectiveness in children is not well-supported. DO NOT USE IN CHILDREN without speaking with your doctor first. 5. Zinc: Zinc lozenges or supplements taken within 24 hours of symptom onset may reduce the duration of cold symptoms.  Non-Medication Remedies 1. Hydration: Drink plenty of fluids like water, herbal teas, and broths to stay hydrated and help thin mucus. 2. Rest: Ensure adequate rest to help your body fight off the infection. 3. Humidified Air: Using a humidifier or taking steamy showers can help relieve nasal congestion and soothe  irritated airways. 4. Nasal Saline Irrigation: Rinsing the nasal passages with saline solution can help clear mucus and relieve congestion. 5. Honey: For children over one year old (do not use in children under one year old), honey can help soothe a sore throat and reduce coughing. 6. Vapor Rub: Applying a mentholated chest rub can help relieve cough and congestion, especially in children.  Prevention Tips 1. Hand Hygiene: Regular hand washing with soap and water can help prevent the spread of cold viruses. 2. Avoid  Close Contact: Stay away from individuals who are sick to reduce the risk of catching a cold.  When to See a Doctor  If symptoms persist for more than 10 days.  If you experience high fever, shortness of breath, or severe headache.  If you have underlying health conditions that may complicate a cold.  Remember, antibiotics are not effective against viruses. Always consult with a healthcare provider before starting any new medication, especially for children.      ED Prescriptions     Medication Sig Dispense Auth. Provider   fluticasone (FLONASE) 50 MCG/ACT nasal spray Place 2 sprays into both nostrils daily. 15.8 mL Howell Lunger, DO      PDMP not reviewed this encounter.   Howell Lunger, [place] 02/23/24 [phone]

## 2024-02-23 NOTE — Discharge Instructions (Signed)
-  Please use 2 sprays of flonase in each nostril one to two times daily for the next 7 to 14 days until symptoms improve - Return to care if you develop fevers, productive cough, or no improvement in the next 1 to 2 weeks  Upper respiratory tract infections cause symptoms such as nasal congestion, runny nose, sore throat, cough, and general malaise. Typically these are viral.  Here are some evidence-based recommendations for managing the common cold at home:  Over-the-Counter Medications 1. Pain Relievers: Acetaminophen (Tylenol) or nonsteroidal anti-inflammatory drugs (NSAIDs) like ibuprofen (Advil) can help reduce fever, sore throat, and body aches. 2. Decongestants: Pseudoephedrine (Sudafed) and phenylephrine can help relieve nasal congestion. These should be used with caution and not for more than three days to avoid rebound congestion. Please do not use these medications if you have high blood pressure or a heart condition. DO NOT USE IN CHILDREN. 3. Antihistamines: Like Zyrtec, combined with decongestants can modestly improve symptoms in adults. Do not take Claritin-D if you have a heart condition. 4. Cough Suppressants: Dextromethorphan may help reduce cough in adults, but its effectiveness in children is not well-supported. DO NOT USE IN CHILDREN without speaking with your doctor first. 5. Zinc: Zinc lozenges or supplements taken within 24 hours of symptom onset may reduce the duration of cold symptoms.  Non-Medication Remedies 1. Hydration: Drink plenty of fluids like water, herbal teas, and broths to stay hydrated and help thin mucus. 2. Rest: Ensure adequate rest to help your body fight off the infection. 3. Humidified Air: Using a humidifier or taking steamy showers can help relieve nasal congestion and soothe irritated airways. 4. Nasal Saline Irrigation: Rinsing the nasal passages with saline solution can help clear mucus and relieve congestion. 5. Honey: For children over one year old  (do not use in children under one year old), honey can help soothe a sore throat and reduce coughing. 6. Vapor Rub: Applying a mentholated chest rub can help relieve cough and congestion, especially in children.  Prevention Tips 1. Hand Hygiene: Regular hand washing with soap and water can help prevent the spread of cold viruses. 2. Avoid Close Contact: Stay away from individuals who are sick to reduce the risk of catching a cold.  When to See a Doctor  If symptoms persist for more than 10 days.  If you experience high fever, shortness of breath, or severe headache.  If you have underlying health conditions that may complicate a cold.  Remember, antibiotics are not effective against viruses. Always consult with a healthcare provider before starting any new medication, especially for children.

## 2024-02-23 NOTE — ED Triage Notes (Signed)
 Sick x 8 days. Woke up with cough Sunday which has not gone away. Had a covid test which was negative. Denies any other symptoms. No fever. No otc meds.

## 2024-03-11 ENCOUNTER — Ambulatory Visit

## 2024-03-11 ENCOUNTER — Ambulatory Visit
Admission: EM | Admit: 2024-03-11 | Discharge: 2024-03-11 | Disposition: A | Discharge status: Home / Self Care | Attending: Family Medicine | Admitting: Family Medicine

## 2024-03-11 DIAGNOSIS — R059 Cough, unspecified: Secondary | ICD-10-CM | POA: Diagnosis not present

## 2024-03-11 DIAGNOSIS — J069 Acute upper respiratory infection, unspecified: Secondary | ICD-10-CM

## 2024-03-11 MED ORDER — AMOXICILLIN-POT CLAVULANATE 875-125 MG PO TABS: 1.0000 | ORAL_TABLET | Freq: Two times a day (BID) | ORAL | 0 refills | Status: AC

## 2024-03-11 MED ORDER — PREDNISONE 20 MG PO TABS: ORAL_TABLET | ORAL | 0 refills | Status: AC

## 2024-03-11 NOTE — ED Triage Notes (Signed)
 Pt presents to [facility] with cough for one month. Pt reports his cough is productive sometimes. And new onset of metallic taste in his mouth. Pt has been taking flonase and nyquill and dayquill

## 2024-03-11 NOTE — ED Provider Notes (Signed)
 [facility]    CSN: [id number] Arrival date & time: 03/11/24  0816      History   Chief Complaint Chief Complaint  Patient presents with   Cough    HPI Patrick Bradley is a 33 y.o. male.   HPI pleasant 33 year old male presents with cough for 1 month reports cough is productive at times and has metallic taste in his mouth.  Reports taking Flonase and NyQuil and DayQuil.  History reviewed. No pertinent past medical history.  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 Authorizing Provider  amoxicillin-clavulanate (AUGMENTIN) 875-125 MG tablet Take 1 tablet by mouth every 12 (twelve) hours. 03/11/24  Yes Teddy Sharper, FNP  predniSONE (DELTASONE) 20 MG tablet Take 3 tabs PO daily x 5 days. 03/11/24  Yes Teddy Sharper, FNP  diclofenac  sodium (VOLTAREN ) 1 % GEL Apply 4 g topically 4 (four) times daily. To affected joint. 03/12/18   Corey, Evan S, MD  fluticasone (FLONASE) 50 MCG/ACT nasal spray Place 2 sprays into both nostrils daily. 02/23/24 02/22/25  Howell Lunger, DO    Family History History reviewed. No pertinent family history.  Social History Social History   Tobacco Use   Smoking status: Never   Smokeless tobacco: Never  Substance Use Topics   Alcohol use: No   Drug use: No     Allergies   Bee pollen and Cat dander   Review of Systems Review of Systems  Respiratory:  Positive for cough.      Physical Exam Triage Vital Signs ED Triage Vitals [03/11/24 0827]  Encounter Vitals Group     BP      Girls Systolic BP Percentile      Girls Diastolic BP Percentile      Boys Systolic BP Percentile      Boys Diastolic BP Percentile      Pulse      Resp      Temp      Temp src       SpO2      Weight      Height      Head Circumference      Peak Flow      Pain Score 0     Pain Loc      Pain Education      Exclude from Growth Chart    No data found.  Updated Vital Signs BP 124/77   Pulse (!) 55   Temp (!) 97.5 F (36.4 C)   Resp 19   SpO2 95%    Physical Exam Vitals and nursing note reviewed.  Constitutional:      Appearance: Normal appearance. He is normal weight. He is ill-appearing.  HENT:     Head: Normocephalic and atraumatic.     Right Ear: Tympanic membrane, ear canal and external ear normal.     Left Ear: Tympanic membrane, ear canal and external ear normal.     Mouth/Throat:     Mouth: Mucous membranes are moist.     Pharynx: Oropharynx is clear.     Comments: Mild clear drainage of posterior oropharynx noted Eyes:     Extraocular Movements: Extraocular movements intact.  Conjunctiva/sclera: Conjunctivae normal.     Pupils: Pupils are equal, round, and reactive to light.  Cardiovascular:     Rate and Rhythm: Normal rate and regular rhythm.     Heart sounds: Normal heart sounds. No murmur heard.    No friction rub. No gallop.  Pulmonary:     Effort: Pulmonary effort is normal.     Breath sounds: No stridor. No wheezing, rhonchi or rales.     Comments: Diminished breath sounds at bases, infrequent nonproductive cough on exam Chest:     Chest wall: No tenderness.  Musculoskeletal:        General: Normal range of motion.  Skin:    General: Skin is warm and dry.  Neurological:     General: No focal deficit present.     Mental Status: He is alert and oriented to person, place, and time. Mental status is at baseline.  Psychiatric:        Mood and Affect: Mood normal.        Behavior: Behavior normal.      UC Treatments / Results  Labs (all labs ordered are listed, but only abnormal results are displayed) Labs Reviewed - No data to display  EKG   Radiology DG Chest 2 View Result Date: 03/11/2024 EXAM: 2 VIEW(S) XRAY  OF THE CHEST 03/11/2024 08:54:51 AM COMPARISON: None available. CLINICAL HISTORY: Cough x 1 month. Table formatting from the original note was not included.; Images from the original note were not included.; Pt presents to uc with cough for one month. Pt reports his cough is productive sometimes. And new onset of metallic taste in his mouth. FINDINGS: LUNGS AND PLEURA: No focal pulmonary opacity. No pulmonary edema. No pleural effusion. No pneumothorax. HEART AND MEDIASTINUM: No acute abnormality of the cardiac and mediastinal silhouettes. BONES AND SOFT TISSUES: No acute osseous abnormality. IMPRESSION: 1. No acute process. Electronically signed by: Waddell Calk MD 03/11/2024 08:57 AM EDT RP Workstation: [id number]    Procedures Procedures (including critical care time)  Medications Ordered in UC Medications - No data to display  Initial Impression / Assessment and Plan / UC Course  I have reviewed the triage vital signs and the nursing notes.  Pertinent labs & imaging results that were available during my care of the patient were reviewed by me and considered in my medical decision making (see chart for details).     MDM: 1.  Cough, unspecified type-CXR results revealed above, patient advised, Rx'd prednisone 20 mg tablet: Take 3 tablets p.o. daily x 5 days; 2.  Acute URI-Rx'd Augmentin 875/125 mg tablet: Take 1 tablet twice daily x 7 days. Advised patient of chest x-ray results with hardcopy provided advised patient to take medications as directed with food to completion.  Advised patient to take prednisone with first dose of Augmentin for the next 5 of 7 days.  Encouraged to increase daily water intake to 64 ounces per day while taking these medications.  Advised if symptoms worsen and/or unresolved please follow-up with your PCP here or here for further evaluation. Final Clinical Impressions(s) / UC Diagnoses   Final diagnoses:  Cough, unspecified type  Acute URI     Discharge  Instructions      Advised patient of chest x-ray results with hardcopy provided advised patient to take medications as directed with food to completion.  Advised patient to take prednisone with first dose of Augmentin for the next 5 of 7 days.  Encouraged to increase daily water intake to 64  ounces per day while taking these medications.  Advised if symptoms worsen and/or unresolved please follow-up with your PCP here or here for further evaluation.     ED Prescriptions     Medication Sig Dispense Auth. Provider   predniSONE (DELTASONE) 20 MG tablet Take 3 tabs PO daily x 5 days. 15 tablet Starlet Gallentine, FNP   amoxicillin-clavulanate (AUGMENTIN) 875-125 MG tablet Take 1 tablet by mouth every 12 (twelve) hours. 14 tablet Donnesha Karg, FNP      PDMP not reviewed this encounter.   Teddy Sharper, FNP 03/11/24 [phone]

## 2024-03-11 NOTE — Discharge Instructions (Addendum)
 Advised patient of chest x-ray results with hardcopy provided advised patient to take medications as directed with food to completion.  Advised patient to take prednisone with first dose of Augmentin for the next 5 of 7 days.  Encouraged to increase daily water intake to 64 ounces per day while taking these medications.  Advised if symptoms worsen and/or unresolved please follow-up with your PCP here or here for further evaluation.

## 2024-03-12 ENCOUNTER — Telehealth: Payer: Self-pay | Admitting: Emergency Medicine

## 2024-03-12 NOTE — Telephone Encounter (Signed)
 Patient states that he's doing better, currently on medications as prescribed.  Will follow up as needed.
